# Patient Record
Sex: Male | Born: 1970 | Race: White | Hispanic: No | Marital: Single | Smoking: Never smoker
Health system: Southern US, Community
[De-identification: ages and names within clinical notes are randomized; demographics above are authoritative.]

## PROBLEM LIST (undated history)

## (undated) DIAGNOSIS — I10 Essential (primary) hypertension: Secondary | ICD-10-CM

## (undated) DIAGNOSIS — K760 Fatty (change of) liver, not elsewhere classified: Secondary | ICD-10-CM

## (undated) DIAGNOSIS — E669 Obesity, unspecified: Secondary | ICD-10-CM

## (undated) DIAGNOSIS — L309 Dermatitis, unspecified: Secondary | ICD-10-CM

## (undated) DIAGNOSIS — N200 Calculus of kidney: Secondary | ICD-10-CM

## (undated) DIAGNOSIS — Z87442 Personal history of urinary calculi: Secondary | ICD-10-CM

## (undated) DIAGNOSIS — K5900 Constipation, unspecified: Secondary | ICD-10-CM

## (undated) DIAGNOSIS — Z87440 Personal history of urinary (tract) infections: Secondary | ICD-10-CM

## (undated) DIAGNOSIS — R7303 Prediabetes: Secondary | ICD-10-CM

## (undated) DIAGNOSIS — F909 Attention-deficit hyperactivity disorder, unspecified type: Secondary | ICD-10-CM

## (undated) DIAGNOSIS — R002 Palpitations: Secondary | ICD-10-CM

## (undated) HISTORY — DX: Obesity, unspecified: E66.9

## (undated) HISTORY — DX: Prediabetes: R73.03

## (undated) HISTORY — DX: Fatty (change of) liver, not elsewhere classified: K76.0

## (undated) HISTORY — DX: Calculus of kidney: N20.0

## (undated) HISTORY — DX: Essential (primary) hypertension: I10

## (undated) HISTORY — PX: OTHER SURGICAL HISTORY: SHX169

## (undated) HISTORY — DX: Dermatitis, unspecified: L30.9

## (undated) HISTORY — DX: Attention-deficit hyperactivity disorder, unspecified type: F90.9

## (undated) HISTORY — DX: Palpitations: R00.2

---

## 2004-03-15 ENCOUNTER — Emergency Department (HOSPITAL_COMMUNITY): Admission: EM | Admit: 2004-03-15 | Discharge: 2004-03-15 | Payer: Self-pay | Admitting: Emergency Medicine

## 2005-03-11 ENCOUNTER — Emergency Department (HOSPITAL_COMMUNITY): Admission: EM | Admit: 2005-03-11 | Discharge: 2005-03-11 | Payer: Self-pay | Admitting: Family Medicine

## 2006-02-21 ENCOUNTER — Emergency Department (HOSPITAL_COMMUNITY): Admission: EM | Admit: 2006-02-21 | Discharge: 2006-02-21 | Payer: Self-pay | Admitting: Emergency Medicine

## 2008-07-29 ENCOUNTER — Emergency Department (HOSPITAL_COMMUNITY): Admission: EM | Admit: 2008-07-29 | Discharge: 2008-07-29 | Payer: Self-pay | Admitting: Family Medicine

## 2008-08-17 ENCOUNTER — Ambulatory Visit: Payer: Self-pay | Admitting: Internal Medicine

## 2008-10-02 ENCOUNTER — Ambulatory Visit: Payer: Self-pay | Admitting: Internal Medicine

## 2008-10-02 LAB — CONVERTED CEMR LAB
Basophils Absolute: 0 10*3/uL (ref 0.0–0.1)
Basophils Relative: 0.5 % (ref 0.0–3.0)
Chloride: 107 meq/L (ref 96–112)
Eosinophils Relative: 1.3 % (ref 0.0–5.0)
GFR calc Af Amer: 141 mL/min
HCT: 46.3 % (ref 39.0–52.0)
Hemoglobin: 15.7 g/dL (ref 13.0–17.0)
Lymphocytes Relative: 31.8 % (ref 12.0–46.0)
MCV: 86.6 fL (ref 78.0–100.0)
Monocytes Absolute: 0.2 10*3/uL (ref 0.1–1.0)
Monocytes Relative: 3.6 % (ref 3.0–12.0)
Neutro Abs: 4.3 10*3/uL (ref 1.4–7.7)
Potassium: 5 meq/L (ref 3.5–5.1)
Sodium: 141 meq/L (ref 135–145)
TSH: 0.83 microintl units/mL (ref 0.35–5.50)
Total Bilirubin: 0.7 mg/dL (ref 0.3–1.2)
Total CHOL/HDL Ratio: 6.5
Total Protein: 7.6 g/dL (ref 6.0–8.3)
VLDL: 16 mg/dL (ref 0–40)

## 2008-10-03 ENCOUNTER — Telehealth: Payer: Self-pay | Admitting: Internal Medicine

## 2008-10-10 ENCOUNTER — Emergency Department (HOSPITAL_COMMUNITY): Admission: EM | Admit: 2008-10-10 | Discharge: 2008-10-10 | Payer: Self-pay | Admitting: Emergency Medicine

## 2008-10-15 ENCOUNTER — Ambulatory Visit: Payer: Self-pay | Admitting: Internal Medicine

## 2008-10-15 DIAGNOSIS — R7402 Elevation of levels of lactic acid dehydrogenase (LDH): Secondary | ICD-10-CM | POA: Insufficient documentation

## 2008-10-15 DIAGNOSIS — R740 Nonspecific elevation of levels of transaminase and lactic acid dehydrogenase [LDH]: Secondary | ICD-10-CM

## 2008-10-15 LAB — CONVERTED CEMR LAB: Iron: 49 ug/dL (ref 42–165)

## 2008-10-16 ENCOUNTER — Encounter: Payer: Self-pay | Admitting: Internal Medicine

## 2008-10-17 ENCOUNTER — Encounter: Payer: Self-pay | Admitting: Internal Medicine

## 2008-10-22 ENCOUNTER — Ambulatory Visit: Payer: Self-pay | Admitting: Diagnostic Radiology

## 2008-10-22 ENCOUNTER — Ambulatory Visit (HOSPITAL_BASED_OUTPATIENT_CLINIC_OR_DEPARTMENT_OTHER): Admission: RE | Admit: 2008-10-22 | Discharge: 2008-10-22 | Payer: Self-pay | Admitting: Internal Medicine

## 2009-06-19 ENCOUNTER — Emergency Department (HOSPITAL_COMMUNITY): Admission: EM | Admit: 2009-06-19 | Discharge: 2009-06-19 | Payer: Self-pay | Admitting: Family Medicine

## 2009-06-20 ENCOUNTER — Ambulatory Visit: Payer: Self-pay | Admitting: Internal Medicine

## 2009-06-20 DIAGNOSIS — R071 Chest pain on breathing: Secondary | ICD-10-CM | POA: Insufficient documentation

## 2010-05-28 ENCOUNTER — Ambulatory Visit: Payer: Self-pay | Admitting: Family

## 2010-05-28 ENCOUNTER — Encounter: Payer: Self-pay | Admitting: Internal Medicine

## 2010-05-28 DIAGNOSIS — I10 Essential (primary) hypertension: Secondary | ICD-10-CM

## 2010-05-28 DIAGNOSIS — R5383 Other fatigue: Secondary | ICD-10-CM

## 2010-05-28 DIAGNOSIS — R5381 Other malaise: Secondary | ICD-10-CM

## 2010-06-03 DIAGNOSIS — R7309 Other abnormal glucose: Secondary | ICD-10-CM

## 2010-06-03 LAB — CONVERTED CEMR LAB
AST: 22 units/L (ref 0–37)
Albumin: 4.6 g/dL (ref 3.5–5.2)
Alkaline Phosphatase: 93 units/L (ref 39–117)
BUN: 19 mg/dL (ref 6–23)
Calcium: 9.6 mg/dL (ref 8.4–10.5)
Creatinine, Ser: 0.88 mg/dL (ref 0.40–1.50)
Glucose, Bld: 119 mg/dL — ABNORMAL HIGH (ref 70–99)
Hemoglobin: 14.7 g/dL (ref 13.0–17.0)
Lymphs Abs: 2.3 10*3/uL (ref 0.7–4.0)
MCHC: 31.3 g/dL (ref 30.0–36.0)
MCV: 89.2 fL (ref 78.0–100.0)
Monocytes Absolute: 0.5 10*3/uL (ref 0.1–1.0)
Platelets: 324 10*3/uL (ref 150–400)
Potassium: 4.5 meq/L (ref 3.5–5.3)
RBC: 5.27 M/uL (ref 4.22–5.81)
RDW: 14.7 % (ref 11.5–15.5)
Sodium: 139 meq/L (ref 135–145)
Total Bilirubin: 0.4 mg/dL (ref 0.3–1.2)

## 2010-06-11 LAB — CONVERTED CEMR LAB: Hgb A1c MFr Bld: 6 % — ABNORMAL HIGH (ref <5.7)

## 2010-06-13 ENCOUNTER — Ambulatory Visit: Payer: Self-pay | Admitting: Internal Medicine

## 2010-06-13 DIAGNOSIS — J309 Allergic rhinitis, unspecified: Secondary | ICD-10-CM | POA: Insufficient documentation

## 2010-06-13 DIAGNOSIS — R0609 Other forms of dyspnea: Secondary | ICD-10-CM | POA: Insufficient documentation

## 2010-06-13 DIAGNOSIS — R0989 Other specified symptoms and signs involving the circulatory and respiratory systems: Secondary | ICD-10-CM

## 2010-06-24 ENCOUNTER — Ambulatory Visit: Payer: Self-pay | Admitting: Pulmonary Disease

## 2010-07-23 ENCOUNTER — Encounter: Payer: Self-pay | Admitting: Pulmonary Disease

## 2010-07-23 ENCOUNTER — Ambulatory Visit (HOSPITAL_BASED_OUTPATIENT_CLINIC_OR_DEPARTMENT_OTHER): Admission: RE | Admit: 2010-07-23 | Discharge: 2010-07-23 | Payer: Self-pay | Admitting: Pulmonary Disease

## 2010-07-25 ENCOUNTER — Ambulatory Visit: Payer: Self-pay | Admitting: Pulmonary Disease

## 2010-08-05 ENCOUNTER — Ambulatory Visit: Payer: Self-pay | Admitting: Pulmonary Disease

## 2010-08-15 ENCOUNTER — Encounter: Payer: Self-pay | Admitting: Pulmonary Disease

## 2010-08-26 ENCOUNTER — Telehealth: Payer: Self-pay | Admitting: Pulmonary Disease

## 2010-11-23 LAB — CONVERTED CEMR LAB
Albumin ELP: 57.2 % (ref 55.8–66.1)
Ceruloplasmin: 44 mg/dL (ref 21–63)
HCV Ab: NEGATIVE
Hep B Core Total Ab: NEGATIVE
Hepatitis B Surface Ag: NEGATIVE

## 2010-11-25 NOTE — Progress Notes (Signed)
  Phone Note Outgoing Call   Summary of Call: Tristan Perez was unable to contact him to do the oximetry. Can we confirm they have the right no?? Initial call taken by: Comer Locket. Vassie Loll MD,  August 26, 2010 9:16 AM  Follow-up for Phone Call        Called [phone] and [email or web address] if he has not heard from [employer] to give me a call back. Called [employer] and spoke with c/s who advised that is the same # they have on file and have left messages. Will close this phone note. Tristan Perez CMA  August 26, 2010 9:47 AM

## 2010-11-25 NOTE — Assessment & Plan Note (Signed)
Summary: EXTREME FATIGUE/DT--Rm 4   Vital Signs:  Patient profile:   40 year old male Height:      73 inches Weight:      360.25 pounds BMI:     47.70 Temp:     98.1 degrees F oral Pulse rate:   84 / minute Pulse rhythm:   regular Resp:     16 per minute BP sitting:   154 / 90  (right arm) Cuff size:   k  Vitals Entered By: Mervin Kung CMA Duncan Dull) (May 28, 2010 8:07 AM) CC: Room 4  Pt states he has been having extreme fatigue with dizziness x 3-4 days. Is Patient Diabetic? No   Primary Care Provider:  Dondra Spry DO  CC:  Room 4  Pt states he has been having extreme fatigue with dizziness x 3-4 days.Marland Kitchen  History of Present Illness: Tristan Perez is a 40 year old male who presents today with complaint of extreme fatigue x 3-4 days.  This fatigue is constant,  this is associated with sense of dizziness, light headedness.  Denies any changes in sleep pattern.  [place] episode at the beach in the middle of [place] which lasted about 24 hours.  He attributed this to dehydration.  Notes that he had a fever of 99.5 on both occasions.   Took aleve and fever resolved.  Denies nausea, vomitting, diarrhea, or abdominal pain.  Denies melena or BRBPR.   Denies sinus pain, pressure, or ear pain.  Patient notes that he snores.  Notes that he had a sleep study which was done at the request of Dr. Catha Gosselin 5-6 years ago which noted snoring, but did not diagnose him with sleep apnea.  Since that time, he has gained 40-50 pounds.    Allergies (verified): No Known Drug Allergies  Review of Systems       see HPI  Physical Exam  General:  Morbidly obese white male awake, alert and in NAD Head:  Normocephalic and atraumatic without obvious abnormalities. No apparent alopecia or balding. Mouth:  Oral mucosa and oropharynx without lesions or exudates.  Teeth in good repair. Neck:  No deformities, masses, or tenderness noted. Lungs:  Normal respiratory effort, chest expands symmetrically.  Lungs are clear to auscultation, no crackles or wheezes. Heart:  Normal rate and regular rhythm. S1 and S2 normal without gallop, murmur, click, rub or other extra sounds. Extremities:  No clubbing, cyanosis, edema, or deformity noted  Neurologic:  alert & oriented X3.   Psych:  Cognition and judgment appear intact. Alert and cooperative with normal attention span and concentration. No apparent delusions, illusions, hallucinations   Impression & Recommendations:  Problem # 1:  FATIGUE (ICD-780.79) Assessment New Will check CBC to rule out anemia, check thyroid to rule out hypothyroid, check bmet.  Will also refer to sleep disorder clinic- it is possible that he has developed sleep apnea which could be contributing to his fatigue given 50 lb weight gain since last sleep study.  Fatigue may also be related to acute viral illness.  EKG performed today notes NSR.  Orders: TLB-BMP (Basic Metabolic Panel-BMET) ([id number]) TLB-TSH (Thyroid Stimulating Hormone) (84443-TSH) TLB-CBC Platelet - w/Differential (85025-CBCD) Sleep Disorder Referral (Sleep Disorder)  Problem # 2:  HYPERTENSION (ICD-401.9) Assessment: Deteriorated Will plan to treat with amlodipine, pt counseled on low sodium diet.  Follow up in 1 month His updated medication list for this problem includes:    Amlodipine Besylate 5 Mg Tabs (Amlodipine besylate) .Marland KitchenMarland KitchenMarland KitchenMarland Kitchen  One tablet by mouth daily  Orders: EKG w/ Interpretation (93000)  BP today: 154/90 Prior BP: 140/90 (06/20/2009)  Labs Reviewed: K+: 5.0 (10/02/2008) Creat: : 0.8 (10/02/2008)   Chol: 171 (10/02/2008)   HDL: 26.3 (10/02/2008)   LDL: 128 (10/02/2008)   TG: 82 (10/02/2008)  Complete Medication List: 1)  Amlodipine Besylate 5 Mg Tabs (Amlodipine besylate) .... One tablet by mouth daily  Other Orders: TLB-Hepatic/Liver Function Pnl ([id number]-HEPATIC)  Patient Instructions: 1)  Please complete labs on the first floor today. 2)  Start amlodipine. 3)  Call if fever  over 101, or if your symptoms worsen.  4)  Follow up with Dr. Artist Pais in 2 weeks. Prescriptions: AMLODIPINE BESYLATE 5 MG TABS (AMLODIPINE BESYLATE) one tablet by mouth daily  #30 x 1   Entered and Authorized by:   Lemont Fillers FNP   Signed by:   Lemont Fillers FNP on 05/28/2010   Method used:   [facility]ronically to        [facility] [phone]* (retail)       [address]       [place], [place]  [address]       Ph: [phone]       Fax: [phone]   RxID:   [id number]   Current Allergies (reviewed today): No known allergies

## 2010-11-25 NOTE — Assessment & Plan Note (Signed)
Summary: follow up after sleep study in [place]//jwr   Visit Type:  Follow-up Primary Tristan Perez/Referring Kris No:  [name] DO  CC:  Follow up after sleep study.  History of Present Illness: 39/M, morbidly obese for evaluation of obstructive sleep apnea  c/o bouts of extreme tiredness in afternoon ,  no caffeine takes an hour to get going in am Epworth Sleepiness Score 12, watching TV, reading , no probs  driving. Late bedtime - always been a night owl, 0100, no TV in bedroom, latency 5 mins, sleeps o side x 2 pillows, 2-3 awakenings, BR visits, oob at 0730 feeling tired, daughter can hear her snoring Gained 40 lbs in last couple of years. Weekends - more sleep but feel worse PSG in 2005 -said he was a snorer but not obstructive sleep apnea   August 05, 2010 2:20 PM  Reviewed PSG >> 3 h TST, no supine sleep AHI 1.4/h, 30 RERAs with RDI 12/h - not sufficient to intervene Susprisingly, low satn x 34 mins -  ? artifact  Preventive Screening-Counseling & Management  Alcohol-Tobacco     Alcohol drinks/day: 0     Smoking Status: never  Current Medications (verified): 1)  Amlodipine Besylate 5 Mg Tabs (Amlodipine Besylate) .... One Tablet By Mouth Daily 2)  Metformin Hcl 500 Mg Tabs (Metformin Hcl) .... One By Mouth Two Times A Day  Allergies (verified): No Known Drug Allergies  Past History:  Past Medical History: Last updated: 06/13/2010 Morbid Obesity Hx of elevated BP w/o diagnosis of hypertension Childhood chickenpox  Hx of abnormal LFT's Fatty liver Episode of palpitations 5 yrs - seen by cardiologist (Echo and EKG reported normal)  Social History: Last updated: 10/15/2008 Occupation: Customer service - Community education officer Divorced Shared custody of daughter Never Smoked Alcohol use-no   Review of Systems  The patient denies anorexia, fever, weight loss, weight gain, vision loss, decreased hearing, hoarseness, chest pain, syncope, dyspnea on exertion, peripheral edema,  prolonged cough, headaches, hemoptysis, abdominal pain, melena, hematochezia, severe indigestion/heartburn, hematuria, muscle weakness, suspicious skin lesions, difficulty walking, depression, unusual weight change, abnormal bleeding, enlarged lymph nodes, and angioedema.    Vital Signs:  Patient profile:   40 year old male Height:      73 inches Weight:      371 pounds BMI:     49.12 O2 Sat:      96 % on Room air Temp:     98.8 degrees F oral Pulse rate:   115 / minute BP sitting:   110 / 78  (right arm) Cuff size:   large  Vitals Entered By: Zackery Barefoot CMA (August 05, 2010 2:07 PM)  O2 Flow:  Room air CC: Follow up after sleep study Comments Medications reviewed with patient Verified contact number and pharmacy with patient Zackery Barefoot CMA  August 05, 2010 2:07 PM    Physical Exam  Additional Exam:  Gen. Pleasant, well-nourished, in no distress, normal affect, wt 360 ENT - no lesions, no post nasal drip, class 3 airway Neck: No JVD, no thyromegaly, no carotid bruits Lungs: no use of accessory muscles, no dullness to percussion, clear without rales or rhonchi  Cardiovascular: Rhythm regular, heart sounds  normal, no murmurs or gallops, no peripheral edema Musculoskeletal: No deformities, no cyanosis or clubbing      Impression & Recommendations:  Problem # 1:  SNORING (ICD-786.09) Discussed pSg results, represents upper airway resistance Weight loss can be curative at this time, if worsens or if persistent fatigue, may  need CPAP intervention. Recehck nocturnal oximetry for desaturation noted during study - likely this was na artifact because he does not have underlying cardiopulmonary disease. Orders: Est. Patient Level III (81191) DME Referral (DME)  Patient Instructions: 1)  Copy sent to:dr Artist Pais 2)  Please schedule a follow-up appointment as needed. 3)  We will check your oxygen levels during sleep & let you know   Not Administered:    Influenza  Vaccine not given due to: will receive at work

## 2010-11-25 NOTE — Letter (Signed)
Summary: Referral attempts/[facility]  Referral attempts/[facility]   Imported By: Lester Saratoga 08/26/2010 08:30:46  _____________________________________________________________________  External Attachment:    Type:   Image     Comment:   External Document

## 2010-11-25 NOTE — Assessment & Plan Note (Signed)
Summary: sleep apnea/reschedulled from 04/17/2010/apc   Primary Cleveland Yarbro/Referring Rubena Roseman:  Dondra Spry DO  CC:  Pt here for sleep consult.  History of Present Illness: 39/M, morbidly obese for evaluation of obstructive sleep apnea  c/o bouts of extreme tiredness in afternoon ,  no caffeine takes an hour to get going in am Epworth Sleepiness Score 12, watching TV, reading , no probs  driving. Late bedtime - always been a night owl, 0100, no TV in bedroom, latency 5 mins, sleeps o side x 2 pillows, 2-3 awakenings, BR visits, oob at 0730 feeling tired, daughter can hear her snoring Gained 40 lbs in last couple of years. Weekends - more sleep but feel worse PSG in 2005 -said he was a snorer but not obstructive sleep apnea  There is no history suggestive of cataplexy, sleep paralysis or parasomnias   Preventive Screening-Counseling & Management  Alcohol-Tobacco     Alcohol drinks/day: 0     Smoking Status: never   History of Present Illness: seem to be tired all the time even when I get a full night sleep  What time do you typically go to bed?(between what hours): 1am-2am  How long does it take you to fall asleep? 5 minutes  How many times during the night do you wake up? 1 to 2 times  What time do you get out of bed to start your day? 7:30am  Do you drive or operate heavy machinery in your occupation? no  How much has your weight changed (up or down) over the past two years? (in pounds): 40 lbs increase  Have you ever had a sleep study before?  If yes,when and where: yes 2005  Do you currently use CPAP ? If so , at what pressure? no  Do you wear oxygen at any time? If yes, how many liters per minute? no Current Medications (verified): 1)  Amlodipine Besylate 5 Mg Tabs (Amlodipine Besylate) .... One Tablet By Mouth Daily 2)  Metformin Hcl 500 Mg Tabs (Metformin Hcl) .... One By Mouth Two Times A Day  Allergies (verified): No Known Drug Allergies  Past  History:  Past Medical History: Last updated: 06/20/2009 Morbid Obesity Hx of elevated BP w/o diagnosis of hypertension Childhood chickenpox Hx of abnormal LFT's Fatty liver Episode of palpitations 5 yrs - seen by cardiologist (Echo and EKG reported normal)  Past Surgical History: Last updated: 10/15/2008 Ingrown toe nail surgery 06/2008   Family History: Last updated: 10/15/2008 Htn - sister Obesity - father Mollie [place] - father Colon ca - no Prostate ca - no Diabetes Mellitus - no  No history of chronic liver disease  Social History: Last updated: 10/15/2008 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  The patient denies shortness of breath with activity, shortness of breath at rest, productive cough, non-productive cough, coughing up blood, chest pain, irregular heartbeats, acid heartburn, indigestion, loss of appetite, abdominal pain, difficulty swallowing, sore throat, tooth/dental problems, headaches, sneezing, itching, ear ache, anxiety, depression, hand/feet swelling, joint stiffness or pain, rash, change in color of mucus, and fever.    Vital Signs:  Patient profile:   40 year old male Height:      73 inches Weight:      360 pounds BMI:     47.67 O2 Sat:      95 % on  Room air Temp:     99.1 degrees F oral Pulse rate:   108 / minute BP sitting:   126 / 82  (left arm) Cuff size:   large  Vitals Entered By: Zackery Barefoot CMA (June 24, 2010 4:21 PM)  O2 Flow:  Room air CC: Pt here for sleep consult Comments Medications reviewed with patient Verified contact number and pharmacy with patient Zackery Barefoot CMA  June 24, 2010 4:26 PM    Physical Exam  Additional Exam:  Gen. Pleasant, well-nourished, in no distress, normal affect, wt 360 ENT - no lesions, no post nasal drip,  class 3 airway Neck: No JVD, no thyromegaly, no carotid bruits Lungs: no use of accessory muscles, no dullness to percussion, clear without rales or rhonchi  Cardiovascular: Rhythm regular, heart sounds  normal, no murmurs or gallops, no peripheral edema Abdomen: soft and non-tender, no hepatosplenomegaly, BS normal. Musculoskeletal: No deformities, no cyanosis or clubbing Neuro:  alert, non focal     Impression & Recommendations:  Problem # 1:  SNORING (ICD-786.09) In v/o excessive daytime somnolence , loud snoring & morbid obesity & narrow pharyngeal exam, obstructive sleep apnea very likely & an overnight pSG will be scheduled as a split study. The pathophysiology of obstructive sleep apnea, it's cardiovascular consequences and modes of treatment including CPAP were discussed with the patient in great detail.  Orders: Sleep Disorder Referral (Sleep Disorder) Consultation Level III (16109)  Problem # 2:  MORBID OBESITY (ICD-278.01)  weight loss encouraged  Orders: Consultation Level III (60454)  Patient Instructions: 1)  Copy sent to:DR Artist Pais 2)  Please schedule a follow-up appointment in 2 weeks after sleep study 3)  Weight Loss

## 2010-11-25 NOTE — Assessment & Plan Note (Signed)
Summary: 2 week follow up/mhf   Vital Signs:  Patient profile:   40 year old male Height:      73 inches Weight:      364.50 pounds BMI:     48.26 O2 Sat:      96 % on Room air Temp:     98.1 degrees F oral Pulse rate:   90 / minute Pulse rhythm:   regular Resp:     18 per minute BP sitting:   132 / 80  (right arm) Cuff size:   large  Vitals Entered By: Glendell Docker CMA (June 13, 2010 8:17 AM)  O2 Flow:  Room air CC: 2 Week Follow up  Is Patient Diabetic? No Pain Assessment Patient in pain? no      Comments no concerns   Primary Care Provider:  Dondra Spry DO  CC:  2 Week Follow up .  History of Present Illness: 40 y/o male prev seen by NP for severe fatigue labs - CBC, TSH, BMET normal pt scheduled for sleep study wt gain since prev visit  Preventive Screening-Counseling & Management  Alcohol-Tobacco     Smoking Status: never  Allergies (verified): No Known Drug Allergies  Past History:  Past Medical History: Morbid Obesity Hx of elevated BP w/o diagnosis of hypertension Childhood chickenpox  Hx of abnormal LFT's Fatty liver Episode of palpitations 5 yrs - seen by cardiologist (Echo and EKG reported normal)  Family History: Htn - sister Obesity - father Mollie [place] - father Colon ca - no Prostate ca - no Diabetes Mellitus - yes No history of chronic liver disease  Physical Exam  General:  alert and overweight-appearing.   Mouth:  pharynx pink and moist and pharyngeal crowding.   Lungs:  normal respiratory effort, normal breath sounds, and no wheezes.   Heart:  normal rate, regular rhythm, and no gallop.   Psych:  normally interactive and good eye contact.     Impression & Recommendations:  Problem # 1:  SNORING (ICD-786.09) Pt has appt for sleep study already. he understands importance of wt loss.  he is currently on nutra system diet  Problem # 2:  HYPERGLYCEMIA (ICD-790.29)  A1c is 6.0.  + fam hx of diabetes.  Pt counseled on  diet and exercise. add metformin.  pt advised to start to 1/2 tab to minimize GI side effects  His updated medication list for this problem includes:    Metformin Hcl 500 Mg Tabs (Metformin hcl) ..... One by mouth two times a day  Labs Reviewed: Creat: 0.88 (05/28/2010)     Future Orders: T-Basic Metabolic Panel [id number]) ... 09/08/2010 T- Hemoglobin A1C ([id number]) ... 09/08/2010  Problem # 3:  ALLERGIC RHINITIS (ICD-477.9) He reports intermittent post nasal gtt and spitting up phlegm in AM Pt to use OTC antihistamines. He defers allergy testing for now  Complete Medication List: 1)  Amlodipine Besylate 5 Mg Tabs (Amlodipine besylate) .... One tablet by mouth daily 2)  Metformin Hcl 500 Mg Tabs (Metformin hcl) .... One by mouth two times a day  Patient Instructions: 1)  Please schedule a follow-up appointment in 3 months. 2)  BMP prior to visit, ICD-9: 790.29 3)  HbgA1C prior to visit, ICD-9:  790.29 4)  Please return for lab work one (1) week before your next appointment.  Prescriptions: METFORMIN HCL 500 MG TABS (METFORMIN HCL) one by mouth two times a day  #60 x 2   Entered and Authorized by:   D. Thomos Lemons  DO   Signed by:   D. Thomos Lemons DO on 06/13/2010   Method used:   [facility] to        [facility] [phone][facility] (retail)       [address]       [place], [place]  [address]       Ph: [phone]       Fax: [phone]   RxID:   [id number]   Current Allergies (reviewed today): No known allergies

## 2011-12-28 ENCOUNTER — Ambulatory Visit: Payer: Medicare HMO

## 2011-12-28 ENCOUNTER — Ambulatory Visit (INDEPENDENT_AMBULATORY_CARE_PROVIDER_SITE_OTHER): Payer: Medicare HMO | Admitting: Family Medicine

## 2011-12-28 VITALS — BP 105/80 | HR 101 | Temp 98.7°F | Resp 20 | Ht 71.5 in | Wt 369.6 lb

## 2011-12-28 DIAGNOSIS — R05 Cough: Secondary | ICD-10-CM

## 2011-12-28 DIAGNOSIS — R0989 Other specified symptoms and signs involving the circulatory and respiratory systems: Secondary | ICD-10-CM

## 2011-12-28 DIAGNOSIS — R059 Cough, unspecified: Secondary | ICD-10-CM

## 2011-12-28 DIAGNOSIS — J988 Other specified respiratory disorders: Secondary | ICD-10-CM

## 2011-12-28 MED ORDER — DOXYCYCLINE HYCLATE 100 MG PO TABS: 100.0000 mg | ORAL_TABLET | Freq: Two times a day (BID) | ORAL | Status: AC

## 2011-12-28 NOTE — Progress Notes (Signed)
  [facility]:  Office Visit  Chief Complaint:  Chief Complaint  Patient presents with  . Fever  . Cough    green/yelloyish phlegm ; congestion - head and chest x 2 weeks    HPI: Tristan Perez is a 41 y.o. male who complains of  8 days of sinus congestion, fever Tmax 102 was given Amoxacillin initially, felt better for 2-3 days then came back with Fever. Switched to Clarithromycin x 5 days. Last temp was 100.0 this AM. Worried about waxing and waning Temp, tried OTC meds with some releif for fever. Marland Kitchen He feels tremendous sinus congestion but now feels it in his chest. Assoc with cough.   Past Medical History  Diagnosis Date  . Obesity    No past surgical history on file. History   Social History  . Marital Status: Single    Spouse Name: N/A    Number of Children: N/A  . Years of Education: N/A   Social History Main Topics  . Smoking status: Never Smoker   . Smokeless tobacco: Not on file  . Alcohol Use: Not on file  . Drug Use: Not on file  . Sexually Active: Not on file   Other Topics Concern  . Not on file   Social History Narrative  . No narrative on file   No family history on file. No Known Allergies Prior to Admission medications   Medication Sig Start Date End Date Taking? Authorizing Provider  clarithromycin (BIAXIN) 500 MG tablet Take 500 mg by mouth 2 (two) times daily.   Yes Historical Provider, MD     ROS: The patient denies chills, night sweats, unintentional weight loss, chest pain, palpitations, wheezing, dyspnea on exertion, nausea, vomiting, abdominal pain, dysuria, hematuria, melena, numbness, weakness, or tingling. + fever, congestion, msk aches,   All other systems have been reviewed and were otherwise negative with the exception of those mentioned in the HPI and as above.    PHYSICAL EXAM: Filed Vitals:   12/28/11 1531  BP: 105/80  Pulse:   Temp:   Resp:    Filed Vitals:   12/28/11 1409  Height: 5' 11.5" (1.816 m)   Weight: 369 lb 9.6 oz (167.649 kg)   Body mass index is 50.83 kg/(m^2).  General: Alert, no acute distress, morbidly obese HEENT:  Normocephalic, atraumatic, oropharynx patent. Tm nl, + sinus tenderness, + red boggy nares, no exudates. Cardiovascular:  Regular rate and rhythm, no rubs murmurs or gallops.  No Carotid bruits, radial pulse intact. No pedal edema.  Respiratory: Clear to auscultation bilaterally.  No wheezes, rales, or rhonchi.  No cyanosis, no use of accessory musculature GI: No organomegaly, abdomen is soft and non-tender, positive bowel sounds.  No masses. Skin: No rashes. Neurologic: Facial musculature symmetric. Psychiatric: Patient is appropriate throughout our interaction. Lymphatic: No cervical lymphadenopathy Musculoskeletal: Gait intact.   LABS: EKG/XRAY:   Primary read interpreted by Dr. Conley Rolls at [facility]. ? Right LL infiltrate vs hilar LAD    ASSESSMENT/PLAN: Encounter Diagnoses  Name Primary?  . Cough Yes  . Congestion of upper airway     DC Biaxin Start Doxycycline for ? RLL infiltrate.  C/w Mucinex without D Check BP at home. IF BP continues to be higher than 140/90 after sxs of head cold and ? PNA are resolved then need to f/u for BP meds. Patient understands and agrees.   Hamilton Capri PHUONG, DO 12/28/2011 3:32 PM

## 2012-01-02 ENCOUNTER — Telehealth: Payer: Self-pay | Admitting: Family Medicine

## 2012-01-02 NOTE — Telephone Encounter (Signed)
Left message to see how he was doing congestion wise, let him know that radiology report was negative for PNA  Although he should still continue with Doxyxycline and finish it out, also need to moniotr his BP if higher than 140/90 consistently  Then need to f/u for BP meds.

## 2014-03-03 ENCOUNTER — Encounter (HOSPITAL_COMMUNITY): Payer: Self-pay | Admitting: Emergency Medicine

## 2014-03-03 ENCOUNTER — Emergency Department (HOSPITAL_COMMUNITY)
Admission: EM | Admit: 2014-03-03 | Discharge: 2014-03-03 | Disposition: A | Discharge status: Home / Self Care | Payer: Managed Care, Other (non HMO) | Source: Home / Self Care | Attending: Emergency Medicine | Admitting: Emergency Medicine

## 2014-03-03 DIAGNOSIS — L905 Scar conditions and fibrosis of skin: Secondary | ICD-10-CM

## 2014-03-03 NOTE — ED Provider Notes (Signed)
Medical screening examination/treatment/procedure(s) were performed by non-physician practitioner and as supervising physician I was immediately available for consultation/collaboration.  Laquinton Bihm, M.D.  Eleazar Kimmey C Rhiann Boucher, MD 03/03/14 1750 

## 2014-03-03 NOTE — ED Notes (Signed)
Pt c/o rash/skin irritation around old scar on left shin Denies recent inj/trauma3 Ambulated well to exam room w/NAD Alert w/no signs of acute distress.

## 2014-03-03 NOTE — ED Provider Notes (Signed)
CSN: [id number]     Arrival date & time 03/03/14  1350 History   First MD Initiated Contact with Patient 03/03/14 1441     Chief Complaint  Patient presents with  . Rash   (Consider location/radiation/quality/duration/timing/severity/associated sxs/prior Treatment) HPI Comments: 43 year old male presents complaining of redness over a scar on his leg. Approximately 20 years ago, he had a laceration on his leg that cut down to his tibia. He has had no issues with this injury since then, but 2 days ago his parents noticed that his scar was really red. His scar has never looked like this before. It still feels fine, there is no pain associated with the scar or any other new symptoms. he feels well overall. No numbness in the foot. No known history of peripheral arterial disease.  Patient is a 43 y.o. male presenting with rash.  Rash   Past Medical History  Diagnosis Date  . Obesity    History reviewed. No pertinent past surgical history. No family history on file. History  Substance Use Topics  . Smoking status: Never Smoker   . Smokeless tobacco: Not on file  . Alcohol Use: Not on file    Review of Systems  Skin: Positive for color change (see history of present illness).  All other systems reviewed and are negative.   Allergies  Review of patient's allergies indicates no known allergies.  Home Medications   Prior to Admission medications   Not on File   BP 186/109  Pulse 104  Temp(Src) 98.9 F (37.2 C) (Oral)  Resp 23  SpO2 95% Physical Exam  Nursing note and vitals reviewed. Constitutional: He is oriented to person, place, and time. He appears well-developed and well-nourished. No distress.  HENT:  Head: Normocephalic.  Pulmonary/Chest: Effort normal. No respiratory distress.  Musculoskeletal:       Legs: Neurological: He is alert and oriented to person, place, and time. Coordination normal.  Skin: Skin is warm and dry. No rash noted. He is not diaphoretic.    Psychiatric: He has a normal mood and affect. Judgment normal.    ED Course  Procedures (including critical care time) Labs Review Labs Reviewed - No data to display  Imaging Review No results found.   MDM   1. Scar of lower leg    The patient may be starting to have a small amount of skin breakdown but at this point is asymptomatic, just with a very small amount of redness on the leg is in the scar. It is also possible that his scar has always like this, the scar does not look particularly abnormal. Watchful waiting for now. Followup if he starts to get any skin breakdown or pain associated with this.  Graylon GoodZachary H Kellianne Ek, PA-C 03/03/14 1455

## 2016-09-08 ENCOUNTER — Encounter (HOSPITAL_COMMUNITY): Payer: Self-pay | Admitting: Emergency Medicine

## 2016-09-08 ENCOUNTER — Emergency Department (HOSPITAL_COMMUNITY): Payer: Managed Care, Other (non HMO)

## 2016-09-08 ENCOUNTER — Emergency Department (HOSPITAL_COMMUNITY)
Admission: EM | Admit: 2016-09-08 | Discharge: 2016-09-09 | Disposition: A | Discharge status: Home / Self Care | Payer: Managed Care, Other (non HMO) | Attending: Emergency Medicine | Admitting: Emergency Medicine

## 2016-09-08 DIAGNOSIS — N201 Calculus of ureter: Secondary | ICD-10-CM

## 2016-09-08 DIAGNOSIS — N132 Hydronephrosis with renal and ureteral calculous obstruction: Secondary | ICD-10-CM | POA: Insufficient documentation

## 2016-09-08 DIAGNOSIS — R1032 Left lower quadrant pain: Secondary | ICD-10-CM | POA: Diagnosis present

## 2016-09-08 DIAGNOSIS — I10 Essential (primary) hypertension: Secondary | ICD-10-CM | POA: Insufficient documentation

## 2016-09-08 DIAGNOSIS — R109 Unspecified abdominal pain: Secondary | ICD-10-CM

## 2016-09-08 MED ORDER — SODIUM CHLORIDE 0.9 % IV SOLN
INTRAVENOUS | Status: DC
  Administered 2016-09-09: via INTRAVENOUS

## 2016-09-08 MED ORDER — FENTANYL CITRATE (PF) 100 MCG/2ML IJ SOLN
50.0000 ug | Freq: Once | INTRAMUSCULAR | Status: AC
  Administered 2016-09-09: 50 ug via INTRAVENOUS
  Filled 2016-09-08: qty 2

## 2016-09-08 MED ORDER — ONDANSETRON HCL 4 MG/2ML IJ SOLN
4.0000 mg | Freq: Once | INTRAMUSCULAR | Status: AC
  Administered 2016-09-09: 4 mg via INTRAVENOUS
  Filled 2016-09-08: qty 2

## 2016-09-08 NOTE — ED Triage Notes (Signed)
Pt is c/o left flank pain that started today around 3 pm  Pt state the pain radiates around to the front and he has nausea associated with the pain  No vomiting at this time  Pt states he had the same on Sunday but it resolved

## 2016-09-08 NOTE — ED Provider Notes (Signed)
[facility]-EMERGENCY DEPT Provider Note   CSN: [id number] Arrival date & time: 09/08/16  2011 By signing my name below, I, Tristan Perez, attest that this documentation has been prepared under the direction and in the presence of No att. providers found. Electronically Signed: [name], ED Scribe. 09/08/16. 11:34 PM.  Time seen 23:34 PM   History   Chief Complaint Chief Complaint  Patient presents with  . Flank Pain   The history is provided by the patient. No language interpreter was used.   HPI Comments: Tristan Perez is a 45 y.o. male who presents to the Emergency Department complaining of sudden left flank pain that began about 3 pm tonight after urinating.  He reports he had similar pain on November 12 however he took Tylenol and it went away. Pt also reports constipation with no BM since November 12 and difficulty urinating tonight. Pt states his pain radiates to his left lower abdomen and is worse when lying flat. Pt states his pain began after urinating 9 hours ago. He took Tylenol with no relief. Pt denies any fevers, chills, hematuria,N/V/D or any other symptoms at this time. Pt has no family history of kidney stones. He denies any injury or change in activity. He denies any change in his diet to account for the constipation.  PCP Tristan Perez.   Past Medical History:  Diagnosis Date  . Obesity    Patient Active Problem List   Diagnosis Date Noted  . ALLERGIC RHINITIS 06/13/2010  . SNORING 06/13/2010  . HYPERGLYCEMIA 06/03/2010  . HYPERTENSION 05/28/2010  . FATIGUE 05/28/2010  . CHEST WALL PAIN, ANTERIOR 06/20/2009  . TRANSAMINASES, SERUM, ELEVATED 10/15/2008  . MORBID OBESITY 08/17/2008   Past Surgical History:  Procedure Laterality Date  . extraction of wisdom teeth      Home Medications    Prior to Admission medications   Medication Sig Start Date End Date Taking? Authorizing Provider  acetaminophen (TYLENOL) 500 MG tablet Take 1,000 mg by mouth every  6 (six) hours as needed for mild pain.   Yes Historical Provider, MD  ciprofloxacin (CIPRO) 500 MG tablet Take 1 tablet (500 mg total) by mouth 2 (two) times daily. 09/09/16   Tristan AlbeIva Kenyada Hy, MD  ondansetron (ZOFRAN) 4 MG tablet Take 1 tablet (4 mg total) by mouth every 8 (eight) hours as needed for nausea or vomiting. 09/09/16   Tristan AlbeIva Annamay Laymon, MD  PERCOCET 5-325 MG tablet Take 1-2 tablets by mouth every 6 (six) hours as needed for severe pain. 09/09/16   Tristan AlbeIva Tristan Szuch, MD  tamsulosin (FLOMAX) 0.4 MG CAPS capsule Take 1 po QD until you pass the stone. 09/09/16   Tristan AlbeIva Tristan Buth, MD   Family History Family History  Problem Relation Age of Onset  . Diabetes Father   . Hypertension Father   . Stroke Other    Social History Social History  Substance Use Topics  . Smoking status: Never Smoker  . Smokeless tobacco: Never Used  . Alcohol use No   Pt works at call center agent for [employer]   Allergies   Patient has no known allergies.  Review of Systems Review of Systems  Constitutional: Negative for chills and fever.  Gastrointestinal: Positive for constipation. Negative for diarrhea, nausea and vomiting.  Genitourinary: Positive for difficulty urinating and flank pain. Negative for hematuria.  All other systems reviewed and are negative.  Physical Exam Updated Vital Signs BP 132/97 (BP Location: Left Arm)   Pulse 109   Temp 98.2 F (36.8 C) (  Oral)   Resp 16   Ht 6' (1.829 m)   Wt (!) 391 lb (177.4 kg)   SpO2 95%   BMI 53.03 kg/m   Vital signs normal except for tachycardia   Physical Exam  Constitutional: He is oriented to person, place, and time. He appears well-developed and well-nourished.  Non-toxic appearance. He does not appear ill. No distress.  Morbidly obese  HENT:  Head: Normocephalic and atraumatic.  Right Ear: External ear normal.  Left Ear: External ear normal.  Nose: Nose normal. No mucosal edema or rhinorrhea.  Mouth/Throat: Oropharynx is clear and moist and mucous  membranes are normal. No dental abscesses or uvula swelling.  Eyes: Conjunctivae and EOM are normal. Pupils are equal, round, and reactive to light.  Neck: Normal range of motion and full passive range of motion without pain. Neck supple.  Cardiovascular: Normal rate, regular rhythm and normal heart sounds.  Exam reveals no gallop and no friction rub.   No murmur heard. Pulmonary/Chest: Effort normal and breath sounds normal. No respiratory distress. He has no wheezes. He has no rhonchi. He has no rales. He exhibits no tenderness and no crepitus.  Abdominal: Soft. Normal appearance and bowel sounds are normal. He exhibits no distension. There is no tenderness. There is no rebound and no guarding.  Genitourinary:  Genitourinary Comments: No CVA tenderness. No thoracic or lumbar spinal tenderness but indicates pain is in left flank.   Musculoskeletal: Normal range of motion. He exhibits no edema or tenderness.       Back:  Moves all extremities well.  Area of pain noted  Neurological: He is alert and oriented to person, place, and time. He has normal strength. No cranial nerve deficit.  Skin: Skin is warm, dry and intact. No rash noted. No erythema. No pallor.  Psychiatric: He has a normal mood and affect. His speech is normal and behavior is normal. His mood appears not anxious.  Nursing note and vitals reviewed.  ED Treatments / Results  DIAGNOSTIC STUDIES: Oxygen Saturation is 95% on room air, normal by my interpretation.    Labs (all labs ordered are listed, but only abnormal results are displayed) Results for orders placed or performed during the hospital encounter of 09/08/16  Urinalysis, Routine w reflex microscopic- may I&O cath if menses  Result Value Ref Range   Color, Urine YELLOW YELLOW   APPearance CLOUDY (A) CLEAR   Specific Gravity, Urine 1.026 1.005 - 1.030   pH 6.0 5.0 - 8.0   Glucose, UA NEGATIVE NEGATIVE mg/dL   Hgb urine dipstick LARGE (A) NEGATIVE   Bilirubin Urine  SMALL (A) NEGATIVE   Ketones, ur 15 (A) NEGATIVE mg/dL   Protein, ur NEGATIVE NEGATIVE mg/dL   Nitrite NEGATIVE NEGATIVE   Leukocytes, UA NEGATIVE NEGATIVE  Comprehensive metabolic panel  Result Value Ref Range   Sodium 138 135 - 145 mmol/L   Potassium 4.3 3.5 - 5.1 mmol/L   Chloride 106 101 - 111 mmol/L   CO2 21 (L) 22 - 32 mmol/L   Glucose, Bld 128 (H) 65 - 99 mg/dL   BUN 18 6 - 20 mg/dL   Creatinine, Ser 9.60 0.61 - 1.24 mg/dL   Calcium 9.5 8.9 - 45.4 mg/dL   Total Protein 7.7 6.5 - 8.1 g/dL   Albumin 4.3 3.5 - 5.0 g/dL   AST 27 15 - 41 U/L   ALT 34 17 - 63 U/L   Alkaline Phosphatase 82 38 - 126 U/L   Total Bilirubin  0.8 0.3 - 1.2 mg/dL   GFR calc non Af Amer >60 >60 mL/min   GFR calc Af Amer >60 >60 mL/min   Anion gap 11 5 - 15  CBC with Differential/Platelet  Result Value Ref Range   WBC 14.7 (H) 4.0 - 10.5 K/uL   RBC 4.96 4.22 - 5.81 MIL/uL   Hemoglobin 13.7 13.0 - 17.0 g/dL   HCT 65.742.3 84.639.0 - 96.252.0 %   MCV 85.3 78.0 - 100.0 fL   MCH 27.6 26.0 - 34.0 pg   MCHC 32.4 30.0 - 36.0 g/dL   RDW 95.214.6 84.111.5 - 32.415.5 %   Platelets 408 (H) 150 - 400 K/uL   Neutrophils Relative % 80 %   Neutro Abs 11.7 (H) 1.7 - 7.7 K/uL   Lymphocytes Relative 15 %   Lymphs Abs 2.2 0.7 - 4.0 K/uL   Monocytes Relative 5 %   Monocytes Absolute 0.7 0.1 - 1.0 K/uL   Eosinophils Relative 0 %   Eosinophils Absolute 0.0 0.0 - 0.7 K/uL   Basophils Relative 0 %   Basophils Absolute 0.0 0.0 - 0.1 K/uL  Urine microscopic-add on  Result Value Ref Range   Squamous Epithelial / LPF 0-5 (A) NONE SEEN   WBC, UA 0-5 0 - 5 WBC/hpf   RBC / HPF 6-30 0 - 5 RBC/hpf   Bacteria, UA FEW (A) NONE SEEN   Urine-Other MUCOUS PRESENT    Laboratory interpretation all normal except For hematuria and borderline increased white cells with bacteria, leukocytosis     Radiology Ct Renal Stone Study  Result Date: 09/09/2016 CLINICAL DATA:  Left flank pain, onset at 15:00 today. EXAM: CT ABDOMEN AND PELVIS WITHOUT  CONTRAST TECHNIQUE: Multidetector CT imaging of the abdomen and pelvis was performed following the standard protocol without IV contrast. COMPARISON:  None. FINDINGS: Lower chest: No acute abnormality. Hepatobiliary: Diffuse fatty infiltration of the liver. No focal liver lesions. Gallbladder and bile ducts appear unremarkable. Pancreas: Unremarkable. No pancreatic ductal dilatation or surrounding inflammatory changes. Spleen: Normal in size without focal abnormality. Adrenals/Urinary Tract: Both adrenals are normal. There is an obstructing 7 x 7 x 11 mm calculus at the left ureteropelvic junction with moderate hydronephrosis. There is a 3 mm upper pole left renal collecting system calculus posteriorly. No right-sided urinary calculi. Urinary bladder is unremarkable. Stomach/Bowel: Stomach is within normal limits. Appendix appears normal. No evidence of bowel wall thickening, distention, or inflammatory changes. Vascular/Lymphatic: No significant vascular findings are present. No enlarged abdominal or pelvic lymph nodes. Reproductive: Unremarkable Other: Fat containing umbilical hernia.  No ascites. Musculoskeletal: No acute or significant osseous findings. IMPRESSION: 1. Obstructing 7 x 7 x 11 mm left UPJ calculus with moderate hydronephrosis. 2. Upper pole left nephrolithiasis. 3. Fat containing umbilical hernia. 4. Fatty liver. Electronically Signed   By: Ellery Plunkaniel R Mitchell M.D.   On: 09/09/2016 00:12    Procedures Procedures (including critical care time)  Medications Ordered in ED Medications  0.9 %  sodium chloride infusion ( Intravenous Stopped 09/09/16 0413)  fentaNYL (SUBLIMAZE) injection 50 mcg (50 mcg Intravenous Given 09/09/16 0011)  ondansetron (ZOFRAN) injection 4 mg (4 mg Intravenous Given 09/09/16 0010)  HYDROmorphone (DILAUDID) injection 1 mg (1 mg Intravenous Given 09/09/16 0043)  ciprofloxacin (CIPRO) tablet 500 mg (500 mg Oral Given 09/09/16 0413)   Initial Impression / Assessment  and Plan / ED Course  I have reviewed the triage vital signs and the nursing notes.  Pertinent labs & imaging results that were available during  my care of the patient were reviewed by me and considered in my medical decision making (see chart for details).  Clinical Course    COORDINATION OF CARE: 11:34 PM Discussed treatment plan with pt at bedside and pt agreed to plan.CT scan was ordered to look for possible renal stone. He was given fentanyl for pain.  When patient returned from CT scan nurse reports his pain is worse and he appears much more uncomfortable. He was given Dilaudid IV.  12:54 AM Pt reports dilaudid is helping with his pain. Discussed CT results with pt and advised to follow up with Urology. Pt is agreeable to plan.   2:29 AM Upon reevaluation, pt is feeling much better and only has pain on occasion. Pt is aware of pending urinalysis and agrees to treatment plan.    After reviewing his urinalysis he was started on Cipro. Patient was given the results of his CT scan and the need to follow-up with urology. His pain was well controlled in the ED and his kidney function is normal. He can be followed by urology as an outpatient.  Final Clinical Impressions(s) / ED Diagnoses   Final diagnoses:  Left flank pain  Left ureteral stone    New Prescriptions Discharge Medication List as of 09/09/2016  3:31 AM    START taking these medications   Details  ciprofloxacin (CIPRO) 500 MG tablet Take 1 tablet (500 mg total) by mouth 2 (two) times daily., Starting Wed 09/09/2016, Print    ondansetron (ZOFRAN) 4 MG tablet Take 1 tablet (4 mg total) by mouth every 8 (eight) hours as needed for nausea or vomiting., Starting Wed 09/09/2016, Print    PERCOCET 5-325 MG tablet Take 1-2 tablets by mouth every 6 (six) hours as needed for severe pain., Starting Wed 09/09/2016, Print    tamsulosin (FLOMAX) 0.4 MG CAPS capsule Take 1 po QD until you pass the stone., Print       Plan  discharge  Tristan Albe, MD, FACEP  I personally performed the services described in this documentation, which was scribed in my presence. The recorded information has been reviewed and considered.  Tristan Albe, MD, Concha Pyo, MD 09/09/16 [phone]

## 2016-09-09 LAB — COMPREHENSIVE METABOLIC PANEL
ALBUMIN: 4.3 g/dL (ref 3.5–5.0)
ALK PHOS: 82 U/L (ref 38–126)
ALT: 34 U/L (ref 17–63)
AST: 27 U/L (ref 15–41)
Anion gap: 11 (ref 5–15)
BUN: 18 mg/dL (ref 6–20)
CALCIUM: 9.5 mg/dL (ref 8.9–10.3)
CO2: 21 mmol/L — AB (ref 22–32)
CREATININE: 0.83 mg/dL (ref 0.61–1.24)
Chloride: 106 mmol/L (ref 101–111)
GFR calc Af Amer: >60 mL/min (ref >60)
GFR calc non Af Amer: >60 mL/min (ref >60)
GLUCOSE: 128 mg/dL — AB (ref 65–99)
Potassium: 4.3 mmol/L (ref 3.5–5.1)
SODIUM: 138 mmol/L (ref 135–145)
Total Bilirubin: 0.8 mg/dL (ref 0.3–1.2)
Total Protein: 7.7 g/dL (ref 6.5–8.1)

## 2016-09-09 LAB — CBC WITH DIFFERENTIAL/PLATELET
BASOS PCT: 0 %
Basophils Absolute: 0 10*3/uL (ref 0.0–0.1)
EOS ABS: 0 10*3/uL (ref 0.0–0.7)
Eosinophils Relative: 0 %
HEMATOCRIT: 42.3 % (ref 39.0–52.0)
Hemoglobin: 13.7 g/dL (ref 13.0–17.0)
Lymphocytes Relative: 15 %
Lymphs Abs: 2.2 10*3/uL (ref 0.7–4.0)
MCH: 27.6 pg (ref 26.0–34.0)
MCHC: 32.4 g/dL (ref 30.0–36.0)
MCV: 85.3 fL (ref 78.0–100.0)
MONO ABS: 0.7 10*3/uL (ref 0.1–1.0)
MONOS PCT: 5 %
NEUTROS ABS: 11.7 10*3/uL — AB (ref 1.7–7.7)
Neutrophils Relative %: 80 %
Platelets: 408 10*3/uL — ABNORMAL HIGH (ref 150–400)
RBC: 4.96 MIL/uL (ref 4.22–5.81)
RDW: 14.6 % (ref 11.5–15.5)
WBC: 14.7 10*3/uL — ABNORMAL HIGH (ref 4.0–10.5)

## 2016-09-09 LAB — URINALYSIS, ROUTINE W REFLEX MICROSCOPIC
Glucose, UA: NEGATIVE mg/dL
Ketones, ur: 15 mg/dL — AB
Leukocytes, UA: NEGATIVE
NITRITE: NEGATIVE
Protein, ur: NEGATIVE mg/dL
SPECIFIC GRAVITY, URINE: 1.026 (ref 1.005–1.030)
pH: 6 (ref 5.0–8.0)

## 2016-09-09 LAB — URINE MICROSCOPIC-ADD ON

## 2016-09-09 MED ORDER — ONDANSETRON HCL 4 MG PO TABS: 4.0000 mg | ORAL_TABLET | Freq: Three times a day (TID) | ORAL | 0 refills | Status: DC | PRN

## 2016-09-09 MED ORDER — PERCOCET 5-325 MG PO TABS: 1.0000 | ORAL_TABLET | Freq: Four times a day (QID) | ORAL | 0 refills | Status: DC | PRN

## 2016-09-09 MED ORDER — TAMSULOSIN HCL 0.4 MG PO CAPS: ORAL_CAPSULE | ORAL | 0 refills | Status: DC

## 2016-09-09 MED ORDER — CIPROFLOXACIN HCL 500 MG PO TABS: 500.0000 mg | ORAL_TABLET | Freq: Two times a day (BID) | ORAL | 0 refills | Status: DC

## 2016-09-09 MED ORDER — CIPROFLOXACIN HCL 500 MG PO TABS
500.0000 mg | ORAL_TABLET | Freq: Once | ORAL | Status: AC
  Administered 2016-09-09: 500 mg via ORAL
  Filled 2016-09-09: qty 1

## 2016-09-09 MED ORDER — HYDROMORPHONE HCL 1 MG/ML IJ SOLN
1.0000 mg | Freq: Once | INTRAMUSCULAR | Status: AC
  Administered 2016-09-09: 1 mg via INTRAVENOUS
  Filled 2016-09-09: qty 1

## 2016-09-09 NOTE — Discharge Instructions (Signed)
Drink plenty of fluids. Take the medications as prescribed. Call [facility] this morning to be seen this afternoon. You have a "7x7x3011mm stone in the UPJ". Tell them I felt you were going to have trouble passing this stone because of it's size and it is stuck high in the ureter (tube that drains urine from the kidney).  They should give you an appointment for this after noon. Return to the ED if you get a fever, or have uncontrolled vomiting or pain.

## 2016-09-09 NOTE — ED Notes (Signed)
Redrew a CBC and sent to main lab.

## 2016-09-10 ENCOUNTER — Other Ambulatory Visit: Payer: Self-pay | Admitting: Urology

## 2016-09-11 ENCOUNTER — Encounter (HOSPITAL_COMMUNITY): Payer: Self-pay | Admitting: *Deleted

## 2016-09-11 NOTE — H&P (Signed)
Office Visit Report     09/09/2016   --------------------------------------------------------------------------------   Tristan Perez  MRN: [id number]  PRIMARY CARE:  Anna GenreKevin L. Little, MD  DOB: January 29, 1971, 45 year old Male  REFERRING:  Ward GivensIva L. Knapp, MD  SSN: -[id number]  PROVIDER:  Heloise PurpuraLester Zakayla Martinec, M.D.    LOCATION:  [facility] [phone]   --------------------------------------------------------------------------------   CC/HPI: Left proximal ureteral stone    Mr. Tristan Perez is a 45 year old gentleman who developed the acute onset of severe left-sided flank pain on Sunday. This relented and then recurred yesterday and became more severe prompting him to go to the emergency department. He underwent a CT scan of the abdomen and pelvis that confirmed an 11 mm x 7 mm proximal left ureteral stone. He has had nausea but no vomiting. He denies any fever. He denies hematuria. He denies a prior history of stones. He does have significant obesity.   He was prescribed Percocet and tamsulosin. His pain has been controlled today.     ALLERGIES: Caffeine    MEDICATIONS: None   GU PSH: None   NON-GU PSH: None   GU PMH: None   NON-GU PMH: GERD Hypertension    FAMILY HISTORY: 1 Daughter - Runs in Family Diabetes - Father Prostate Cancer - Grandfather stroke - Grandmother   SOCIAL HISTORY: Marital Status: Single Current Smoking Status: Patient has never smoked.  Has never drank.  Does not drink caffeine.    REVIEW OF SYSTEMS:    GU Review Male:   Patient reports frequent urination. Patient denies hard to postpone urination, burning/ pain with urination, leakage of urine, stream starts and stops, trouble starting your streams, and have to strain to urinate .  Gastrointestinal (Upper):   Patient denies nausea and vomiting.  Gastrointestinal (Lower):   Patient denies diarrhea and constipation.  Constitutional:   Patient denies fever, night sweats, weight loss, and  fatigue.  Skin:   Patient denies skin rash/ lesion and itching.  Eyes:   Patient denies blurred vision and double vision.  Ears/ Nose/ Throat:   Patient denies sinus problems and sore throat.  Hematologic/Lymphatic:   Patient denies swollen glands and easy bruising.  Cardiovascular:   Patient denies leg swelling and chest pains.  Respiratory:   Patient denies cough and shortness of breath.  Endocrine:   Patient denies excessive thirst.  Musculoskeletal:   Patient denies back pain and joint pain.  Neurological:   Patient denies headaches and dizziness.  Psychologic:   Patient denies depression and anxiety.   VITAL SIGNS:      11 /15/2017 02:49 PM  Weight 390 lb / 176.9 kg  Height 72 in / 182.88 cm  BP 149/86 mmHg  Pulse 106 /min  BMI 52.9 kg/m   MULTI-SYSTEM PHYSICAL EXAMINATION:    Constitutional: Well-nourished. No physical deformities. Normally developed. Good grooming.  Neck: Neck symmetrical, not swollen. Normal tracheal position.  Respiratory: No labored breathing, no use of accessory muscles. Clear bilaterally.  Cardiovascular: Normal temperature, normal extremity pulses, no swelling, no varicosities. Regular rate and rhythm.  Lymphatic: No enlargement of neck, axillae, groin.  Skin: No paleness, no jaundice, no cyanosis. No lesion, no ulcer, no rash.  Neurologic / Psychiatric: Oriented to time, oriented to place, oriented to person. No depression, no anxiety, no agitation.  Gastrointestinal: Obese. No tenderness.  Eyes: Normal conjunctivae. Normal eyelids.  Ears, Nose, Mouth, and Throat: Left ear no scars, no lesions, no masses. Right ear no scars, no lesions, no masses.  Nose no scars, no lesions, no masses. Normal hearing. Normal lips.  Musculoskeletal: Normal gait and station of head and neck.     PAST DATA REVIEWED:  Source Of History:  Patient  Records Review:   Previous Patient Records  Urine Test Review:   Urinalysis  X-Ray Review: C.T. Abdomen/Pelvis: Reviewed  Films.    Notes:                     Renal function was normal in the emergency department.I think my head of the water without think it's orifice and traversed dictating, Verner CholRoderick second there is no spreading the patient is a thanks so much  A great job thank you   PROCEDURES:          Urinalysis w/Scope Dipstick Dipstick Cont'd Micro  Color: Yellow Bilirubin: Neg WBC/hpf: 0 - 5/hpf  Appearance: Clear Ketones: Neg RBC/hpf: 0 - 2/hpf  Specific Gravity: 1.020 Blood: 1+ Bacteria: NS (Not Seen)  pH: 6.0 Protein: Neg Cystals: NS (Not Seen)  Glucose: Neg Urobilinogen: 0.2 Casts: NS (Not Seen)    Nitrites: Neg Trichomonas: Not Present    Leukocyte Esterase: Neg Mucous: Present      Epithelial Cells: 0 - 5/hpf      Yeast: NS (Not Seen)      Sperm: Not Present    ASSESSMENT:      ICD-10 Details  1 GU:   Calculus Ureter - N20.1    PLAN:           Schedule Return Visit: Other See Visit Notes             Note: Will call to schedule surgery          Document Letter(s):  Created for Patient: Clinical Summary         Notes:   1. Left proximal ureteral stone: We discussed that his stone is a very low chance of passing spontaneously considering the size of the stone. We reviewed options for definitive treatment including shockwave lithotripsy, ureteroscopic laser lithotripsy, and percutaneous nephrolithotomy. After reviewing the pros and cons of each approach, and considering his significant obesity, I recommended proceeding with ureteroscopic laser lithotripsy. We reviewed this procedure in detail including the potential risks, consultations, and the expected recovery process as well as the need for a probable postoperative stent. All questions were answered to his stated satisfaction. He gives informed consent to proceed. This will be scheduled shortly after the Thanksgiving holiday at his request as he does have some vacation time around the weekend after Thanksgiving.   Cc: Dr. Catha GosselinKevin Little     * Signed by Heloise PurpuraLester Evangelia Whitaker, M.D. on 09/09/16 at 8:13 PM (EST)*

## 2016-09-13 MED ORDER — DEXTROSE 5 % IV SOLN
3.0000 g | INTRAVENOUS | Status: AC
  Administered 2016-09-14: 3 g via INTRAVENOUS
  Filled 2016-09-13: qty 3

## 2016-09-14 ENCOUNTER — Ambulatory Visit (HOSPITAL_COMMUNITY): Payer: Managed Care, Other (non HMO) | Admitting: Anesthesiology

## 2016-09-14 ENCOUNTER — Encounter (HOSPITAL_COMMUNITY): Payer: Self-pay | Admitting: *Deleted

## 2016-09-14 ENCOUNTER — Encounter (HOSPITAL_COMMUNITY)
Admission: RE | Disposition: A | Discharge status: Home / Self Care | Payer: Self-pay | Source: Ambulatory Visit | Attending: Urology

## 2016-09-14 ENCOUNTER — Ambulatory Visit (HOSPITAL_COMMUNITY)
Admission: RE | Admit: 2016-09-14 | Discharge: 2016-09-14 | Disposition: A | Discharge status: Home / Self Care | Payer: Managed Care, Other (non HMO) | Source: Ambulatory Visit | Attending: Urology | Admitting: Urology

## 2016-09-14 DIAGNOSIS — Z8042 Family history of malignant neoplasm of prostate: Secondary | ICD-10-CM | POA: Diagnosis not present

## 2016-09-14 DIAGNOSIS — Z823 Family history of stroke: Secondary | ICD-10-CM | POA: Diagnosis not present

## 2016-09-14 DIAGNOSIS — N201 Calculus of ureter: Secondary | ICD-10-CM | POA: Diagnosis present

## 2016-09-14 DIAGNOSIS — I10 Essential (primary) hypertension: Secondary | ICD-10-CM | POA: Insufficient documentation

## 2016-09-14 DIAGNOSIS — Z91048 Other nonmedicinal substance allergy status: Secondary | ICD-10-CM | POA: Diagnosis not present

## 2016-09-14 DIAGNOSIS — Z6841 Body Mass Index (BMI) 40.0 and over, adult: Secondary | ICD-10-CM | POA: Insufficient documentation

## 2016-09-14 DIAGNOSIS — Z833 Family history of diabetes mellitus: Secondary | ICD-10-CM | POA: Insufficient documentation

## 2016-09-14 HISTORY — PX: CYSTOSCOPY/URETEROSCOPY/HOLMIUM LASER/STENT PLACEMENT: SHX6546

## 2016-09-14 HISTORY — DX: Essential (primary) hypertension: I10

## 2016-09-14 HISTORY — DX: Personal history of urinary (tract) infections: Z87.440

## 2016-09-14 HISTORY — DX: Personal history of urinary calculi: Z87.442

## 2016-09-14 LAB — BASIC METABOLIC PANEL
ANION GAP: 13 (ref 5–15)
BUN: 17 mg/dL (ref 6–20)
CHLORIDE: 102 mmol/L (ref 101–111)
CO2: 21 mmol/L — AB (ref 22–32)
CREATININE: 1.06 mg/dL (ref 0.61–1.24)
Calcium: 9.3 mg/dL (ref 8.9–10.3)
GFR calc non Af Amer: >60 mL/min (ref >60)
GLUCOSE: 110 mg/dL — AB (ref 65–99)
Potassium: 3.5 mmol/L (ref 3.5–5.1)
Sodium: 136 mmol/L (ref 135–145)

## 2016-09-14 LAB — CBC
HCT: 43.3 % (ref 39.0–52.0)
HEMOGLOBIN: 14.1 g/dL (ref 13.0–17.0)
MCH: 27.7 pg (ref 26.0–34.0)
MCHC: 32.6 g/dL (ref 30.0–36.0)
MCV: 85.1 fL (ref 78.0–100.0)
Platelets: 357 10*3/uL (ref 150–400)
RBC: 5.09 MIL/uL (ref 4.22–5.81)
RDW: 14.3 % (ref 11.5–15.5)
WBC: 9.2 10*3/uL (ref 4.0–10.5)

## 2016-09-14 LAB — TYPE AND SCREEN
ABO/RH(D): A POS
Antibody Screen: NEGATIVE

## 2016-09-14 LAB — ABO/RH: ABO/RH(D): A POS

## 2016-09-14 SURGERY — CYSTOSCOPY/URETEROSCOPY/HOLMIUM LASER/STENT PLACEMENT
Anesthesia: General | Laterality: Left

## 2016-09-14 MED ORDER — SUCCINYLCHOLINE CHLORIDE 20 MG/ML IJ SOLN
INTRAMUSCULAR | Status: DC | PRN
  Administered 2016-09-14: 160 mg via INTRAVENOUS
  Administered 2016-09-14: 20 mg via INTRAVENOUS

## 2016-09-14 MED ORDER — LACTATED RINGERS IV SOLN
INTRAVENOUS | Status: DC
  Administered 2016-09-14: 1000 mL via INTRAVENOUS
  Administered 2016-09-14: 14:00:00 via INTRAVENOUS

## 2016-09-14 MED ORDER — FENTANYL CITRATE (PF) 100 MCG/2ML IJ SOLN
INTRAMUSCULAR | Status: AC
  Filled 2016-09-14: qty 2

## 2016-09-14 MED ORDER — PROPOFOL 10 MG/ML IV BOLUS
INTRAVENOUS | Status: DC | PRN
  Administered 2016-09-14: 60 mg via INTRAVENOUS
  Administered 2016-09-14: 200 mg via INTRAVENOUS
  Administered 2016-09-14: 60 mg via INTRAVENOUS

## 2016-09-14 MED ORDER — IOHEXOL 300 MG/ML  SOLN
INTRAMUSCULAR | Status: DC | PRN
  Administered 2016-09-14: 7 mL

## 2016-09-14 MED ORDER — OXYCODONE-ACETAMINOPHEN 5-325 MG PO TABS: 1.0000 | ORAL_TABLET | ORAL | 0 refills | Status: DC | PRN

## 2016-09-14 MED ORDER — DEXAMETHASONE SODIUM PHOSPHATE 10 MG/ML IJ SOLN
INTRAMUSCULAR | Status: DC | PRN
  Administered 2016-09-14: 10 mg via INTRAVENOUS

## 2016-09-14 MED ORDER — MIDAZOLAM HCL 5 MG/5ML IJ SOLN
INTRAMUSCULAR | Status: DC | PRN
  Administered 2016-09-14: 2 mg via INTRAVENOUS

## 2016-09-14 MED ORDER — FENTANYL CITRATE (PF) 100 MCG/2ML IJ SOLN: 25.0000 ug | INTRAMUSCULAR | Status: DC | PRN

## 2016-09-14 MED ORDER — SODIUM CHLORIDE 0.9 % IR SOLN
Status: DC | PRN
  Administered 2016-09-14: 3000 mL

## 2016-09-14 MED ORDER — PROMETHAZINE HCL 25 MG/ML IJ SOLN: 6.2500 mg | INTRAMUSCULAR | Status: DC | PRN

## 2016-09-14 MED ORDER — OXYCODONE-ACETAMINOPHEN 5-325 MG PO TABS
1.0000 | ORAL_TABLET | ORAL | Status: DC | PRN
  Administered 2016-09-14: 1 via ORAL
  Filled 2016-09-14: qty 1

## 2016-09-14 MED ORDER — PHENYLEPHRINE HCL 10 MG/ML IJ SOLN
INTRAMUSCULAR | Status: DC | PRN
  Administered 2016-09-14 (×3): 80 ug via INTRAVENOUS

## 2016-09-14 MED ORDER — FENTANYL CITRATE (PF) 100 MCG/2ML IJ SOLN
INTRAMUSCULAR | Status: DC | PRN
  Administered 2016-09-14: 100 ug via INTRAVENOUS
  Administered 2016-09-14: 50 ug via INTRAVENOUS

## 2016-09-14 MED ORDER — PROPOFOL 10 MG/ML IV BOLUS
INTRAVENOUS | Status: AC
  Filled 2016-09-14: qty 40

## 2016-09-14 MED ORDER — LIDOCAINE 2% (20 MG/ML) 5 ML SYRINGE
INTRAMUSCULAR | Status: DC | PRN
  Administered 2016-09-14: 80 mg via INTRAVENOUS

## 2016-09-14 MED ORDER — MIDAZOLAM HCL 2 MG/2ML IJ SOLN
INTRAMUSCULAR | Status: AC
  Filled 2016-09-14: qty 2

## 2016-09-14 MED ORDER — SODIUM CHLORIDE 0.9 % IR SOLN
Status: DC | PRN
  Administered 2016-09-14: 2000 mL

## 2016-09-14 SURGICAL SUPPLY — 24 items
BAG URO CATCHER STRL LF (MISCELLANEOUS) ×3 IMPLANT
BASKET ZERO TIP 1.9FR (BASKET) ×3 IMPLANT
BASKET ZERO TIP NITINOL 2.4FR (BASKET) IMPLANT
BOSTON SCIENTIFIC ×3 IMPLANT
BSKT STON RTRVL ZERO TP 1.9FR (BASKET) ×1
BSKT STON RTRVL ZERO TP 2.4FR (BASKET)
CATH INTERMIT  6FR 70CM (CATHETERS) ×3 IMPLANT
CLOTH BEACON ORANGE TIMEOUT ST (SAFETY) ×3 IMPLANT
FIBER LASER FLEXIVA 365 (UROLOGICAL SUPPLIES) IMPLANT
FIBER LASER TRAC TIP (UROLOGICAL SUPPLIES) ×3 IMPLANT
GLOVE BIOGEL M STRL SZ7.5 (GLOVE) ×3 IMPLANT
GOWN STRL REUS W/TWL LRG LVL3 (GOWN DISPOSABLE) ×6 IMPLANT
GUIDEWIRE ANG ZIPWIRE 038X150 (WIRE) IMPLANT
GUIDEWIRE STR DUAL SENSOR (WIRE) ×3 IMPLANT
IV NS 1000ML (IV SOLUTION)
IV NS 1000ML BAXH (IV SOLUTION) IMPLANT
KIT BALLN UROMAX 15FX4 (MISCELLANEOUS) ×1 IMPLANT
KIT BALLN UROMAX 26 75X4 (MISCELLANEOUS) ×2
MANIFOLD NEPTUNE II (INSTRUMENTS) ×3 IMPLANT
PACK CYSTO (CUSTOM PROCEDURE TRAY) ×3 IMPLANT
SHEATH ACCESS URETERAL 38CM (SHEATH) ×3 IMPLANT
STENT CONTOUR 6FRX26X.038 (STENTS) ×3 IMPLANT
TUBING CONNECTING 10 (TUBING) ×2 IMPLANT
TUBING CONNECTING 10' (TUBING) ×1

## 2016-09-14 NOTE — Transfer of Care (Signed)
Immediate Anesthesia Transfer of Care Note  Patient: Tristan Perez  Procedure(s) Performed: Procedure(s): CYSTOSCOPY/RETROGRADES/URETEROSCOPY/HOLMIUM LASER/STENT PLACEMENT (Left)  Patient Location: PACU  Anesthesia Type:General  Level of Consciousness: awake, alert  and oriented  Airway & Oxygen Therapy: Patient Spontanous Breathing and Patient connected to face mask oxygen  Post-op Assessment: Report given to RN and Post -op Vital signs reviewed and stable  Post vital signs: Reviewed and stable  Last Vitals:  Vitals:   09/14/16 1139  BP: (!) 176/96  Pulse: (!) 102  Resp: 20  Temp: 37.2 C    Last Pain:  Vitals:   09/14/16 1254  TempSrc:   PainSc: 2       Patients Stated Pain Goal: 4 (09/14/16 1254)  Complications: No apparent anesthesia complications

## 2016-09-14 NOTE — OR Nursing (Signed)
Stone taken by Dr. Borden 

## 2016-09-14 NOTE — Op Note (Signed)
Preoperative diagnosis: Left proximal ureteral calculus  Postoperative diagnosis: Left proximal ureteral calculus  Procedure:  1. Cystoscopy 2. Left ureteroscopy and stone removal 3. Ureteroscopic laser lithotripsy 4. Left ureteral stent placement (6 x 26 - no string) 5. Left retrograde pyelography with interpretation 6. Urethral dilation 7. Dilation of distal left ureter  Surgeon: Moody BruinsLester S. Lliam Hoh, Jr. M.D.  Anesthesia: General  Complications: None  Intraoperative findings: Left retrograde pyelography demonstrated a filling defect within the proximal left ureter consistent with the patient's known calculus without other abnormalities noted.  EBL: Minimal  Specimens: 1. Left ureteral calculus  Disposition of specimens: [facility] for stone analysis  Indication: Tristan Perez  is a 45 y.o. patient with an 11 x 8 mm left proximal ureteral stone. After reviewing the management options for treatment, they elected to proceed with the above surgical procedure(s). We have discussed the potential benefits and risks of the procedure, side effects of the proposed treatment, the likelihood of the patient achieving the goals of the procedure, and any potential problems that might occur during the procedure or recuperation. Informed consent has been obtained.  Description of procedure:  The patient was taken to the operating room and general anesthesia was induced.  The patient was placed in the dorsal lithotomy position, prepped and draped in the usual sterile fashion, and preoperative antibiotics were administered. A preoperative time-out was performed.   Cystourethroscopy was performed.  The urethral meatus required serial dilation with Sissy HoffVan Buren sounds up to 28 Fr to allow the cystoscope to pass into the urethra.  The patient's urethra was examined and was normal. The bladder was then systematically examined in its entirety. There was no evidence for any bladder  tumors, stones, or other mucosal pathology.    Attention then turned to the left ureteral orifice and a ureteral catheter was used to intubate the ureteral orifice.  Omnipaque contrast was injected through the ureteral catheter and a retrograde pyelogram was performed with findings as dictated above.  A 0.38 sensor guidewire was then advanced up the left ureter into the renal pelvis under fluoroscopic guidance.  A 12/14 Fr ureteral access sheath was then advanced over the guide wire. Initially the ureter would not allow the access sheath to pass even when just the inner sheath was used.  It was not forced and the decision was made to perform ureteral dilation.  The 4 cm ureteral balloon dilator was used to dilate the distal left ureter.  The access sheath was then able to be passed into the ureter without resistance. The digital flexible ureteroscope was then advanced through the access sheath into the ureter next to the guidewire and the calculus was identified and was located in the proximal left ureter.   The stone was then fragmented with the 200 micron holmium laser fiber on a setting of 0.6 J and frequency of 6 Hz.  The stone was very hard and required extensive lithotripsy.   All sizable stones were then removed with a zero tip nitinol basket.  Reinspection of the ureter/renal pelvis revealed no remaining visible stones or fragments of significant size.   The safety wire was then replaced and the access sheath removed.  The guidewire was backloaded through the cystoscope and a ureteral stent was advance over the wire using Seldinger technique.  The stent was positioned appropriately under fluoroscopic and cystoscopic guidance.  The wire was then removed with an adequate stent curl noted in the renal pelvis as well as in the bladder.  The bladder was then emptied and the procedure ended.  The patient appeared to tolerate the procedure well and without complications.  The patient was able to be  awakened and transferred to the recovery unit in satisfactory condition.   Moody BruinsLester S. Willene Holian, Jr. MD

## 2016-09-14 NOTE — Anesthesia Preprocedure Evaluation (Addendum)
Anesthesia Evaluation  Patient identified by MRN, date of birth, ID band Patient awake    Reviewed: Allergy & Precautions, NPO status , Patient's Chart, lab work & pertinent test results  History of Anesthesia Complications Negative for: history of anesthetic complications  Airway Mallampati: III  TM Distance: >3 FB Neck ROM: Full    Dental  (+) Teeth Intact, Dental Advisory Given   Pulmonary neg pulmonary ROS, neg shortness of breath, neg sleep apnea (negative sleep study x2), neg COPD, neg recent URI,    Pulmonary exam normal breath sounds clear to auscultation       Cardiovascular hypertension, (-) angina(-) Past MI, (-) Cardiac Stents, (-) CABG, (-) Peripheral Vascular Disease, (-) Orthopnea, (-) PND and (-) DOE (-) dysrhythmias  Rhythm:Regular Rate:Tachycardia  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   Neuro/Psych negative neurological ROS     GI/Hepatic negative GI ROS, Neg liver ROS,   Endo/Other  neg diabetesMorbid obesity  Renal/GU Renal disease (nephrolithiasis)     Musculoskeletal   Abdominal (+) + obese,   Peds  Hematology negative hematology ROS (+)   Anesthesia Other Findings   Reproductive/Obstetrics                            Anesthesia Physical Anesthesia Plan  ASA: III  Anesthesia Plan: General   Post-op Pain Management:    Induction: Intravenous  Airway Management Planned: Oral ETT and Video Laryngoscope Planned  Additional Equipment:   Intra-op Plan:   Post-operative Plan: Extubation in OR  Informed Consent: I have reviewed the patients History and Physical, chart, labs and discussed the procedure including the risks, benefits and alternatives for the proposed anesthesia with the patient or authorized representative who has indicated his/her understanding and acceptance.   Dental advisory given  Plan Discussed with: CRNA  Anesthesia Plan  Comments: (Risks of general anesthesia discussed including, but not limited to, sore throat, hoarse voice, chipped/damaged teeth, injury to vocal cords, nausea and vomiting, allergic reactions, lung infection, heart attack, stroke, and death. All questions answered. )       Anesthesia Quick Evaluation

## 2016-09-14 NOTE — Anesthesia Procedure Notes (Signed)
Procedure Name: Intubation Performed by: Kizzie FantasiaARVER, Zyliah Schier J Pre-anesthesia Checklist: Patient identified, Emergency Drugs available, Suction available, Patient being monitored and Timeout performed Patient Re-evaluated:Patient Re-evaluated prior to inductionOxygen Delivery Method: Circle system utilized Preoxygenation: Pre-oxygenation with 100% oxygen Intubation Type: IV induction Ventilation: Mask ventilation without difficulty Laryngoscope Size: Mac and 4 Grade View: Grade III Tube type: Oral Tube size: 7.5 mm Number of attempts: 1 Airway Equipment and Method: Stylet Placement Confirmation: ETT inserted through vocal cords under direct vision,  positive ETCO2,  CO2 detector and breath sounds checked- equal and bilateral Secured at: 23 cm Tube secured with: Tape Dental Injury: Teeth and Oropharynx as per pre-operative assessment  Difficulty Due To: Difficulty was anticipated Future Recommendations: Recommend- induction with short-acting agent, and alternative techniques readily available

## 2016-09-14 NOTE — Interval H&P Note (Signed)
History and Physical Interval Note:  09/14/2016 12:53 PM  Tristan EarthlyDouglas M Mikula  has presented today for surgery, with the diagnosis of LEFT PROXIMAL URETERAL STONE  The various methods of treatment have been discussed with the patient and family. After consideration of risks, benefits and other options for treatment, the patient has consented to  Procedure(s): CYSTOSCOPY/URETEROSCOPY/HOLMIUM LASER/STENT PLACEMENT (Left) as a surgical intervention .  The patient's history has been reviewed, patient examined, no change in status, stable for surgery.  I have reviewed the patient's chart and labs.  Questions were answered to the patient's satisfaction.     Eugene Isadore,LES

## 2016-09-14 NOTE — Discharge Instructions (Addendum)
General Anesthesia, Adult, Care After °These instructions provide you with information about caring for yourself after your procedure. Your health care provider may also give you more specific instructions. Your treatment has been planned according to current medical practices, but problems sometimes occur. Call your health care provider if you have any problems or questions after your procedure. °What can I expect after the procedure? °After the procedure, it is common to have: °Vomiting. °A sore throat. °Mental slowness. °It is common to feel: °Nauseous. °Cold or shivery. °Sleepy. °Tired. °Sore or achy, even in parts of your body where you did not have surgery. °Follow these instructions at home: °For at least 24 hours after the procedure:  °Do not: °Participate in activities where you could fall or become injured. °Drive. °Use heavy machinery. °Drink alcohol. °Take sleeping pills or medicines that cause drowsiness. °Make important decisions or sign legal documents. °Take care of children on your own. °Rest. °Eating and drinking  °If you vomit, drink water, juice, or soup when you can drink without vomiting. °Drink enough fluid to keep your urine clear or pale yellow. °Make sure you have little or no nausea before eating solid foods. °Follow the diet recommended by your health care provider. °General instructions  °Have a responsible adult stay with you until you are awake and alert. °Return to your normal activities as told by your health care provider. Ask your health care provider what activities are safe for you. °Take over-the-counter and prescription medicines only as told by your health care provider. °If you smoke, do not smoke without supervision. °Keep all follow-up visits as told by your health care provider. This is important. °Contact a health care provider if: °You continue to have nausea or vomiting at home, and medicines are not helpful. °You cannot drink fluids or start eating again. °You cannot  urinate after 8-12 hours. °You develop a skin rash. °You have fever. °You have increasing redness at the site of your procedure. °Get help right away if: °You have difficulty breathing. °You have chest pain. °You have unexpected bleeding. °You feel that you are having a life-threatening or urgent problem. °This information is not intended to replace advice given to you by your health care provider. Make sure you discuss any questions you have with your health care provider. °Document Released: 01/18/2001 Document Revised: 03/16/2016 Document Reviewed: 09/26/2015 °Elsevier Interactive Patient Education © 2017 [employer] ° ° ° ° °1. You may see some blood in the urine and may have some burning with urination for 48-72 hours. You also may notice that you have to urinate more frequently or urgently after your procedure which is normal.  °2. You should call should you develop an inability urinate, fever > 101, persistent nausea and vomiting that prevents you from eating or drinking to stay hydrated.  °3. If you have a stent, you will likely urinate more frequently and urgently until the stent is removed and you may experience some discomfort/pain in the lower abdomen and flank especially when urinating. You may take pain medication prescribed to you if needed for pain. You may also intermittently have blood in the urine until the stent is removed. ° ° °

## 2016-09-14 NOTE — Anesthesia Postprocedure Evaluation (Signed)
Anesthesia Post Note  Patient: Tristan Perez  Procedure(s) Performed: Procedure(s) (LRB): CYSTOSCOPY/RETROGRADES/URETEROSCOPY/HOLMIUM LASER/STENT PLACEMENT (Left)  Patient location during evaluation: PACU Anesthesia Type: General Level of consciousness: awake and alert and patient cooperative Pain management: pain level controlled Vital Signs Assessment: post-procedure vital signs reviewed and stable Respiratory status: spontaneous breathing and respiratory function stable Cardiovascular status: stable Anesthetic complications: no    Last Vitals:  Vitals:   09/14/16 1512 09/14/16 1524  BP: 140/86 (!) 164/81  Pulse: 95 94  Resp: 16 18  Temp: 36.9 C 36.6 C    Last Pain:  Vitals:   09/14/16 1534  TempSrc:   PainSc: 6                  Shemeka Wardle S

## 2016-09-15 ENCOUNTER — Encounter (HOSPITAL_COMMUNITY): Payer: Self-pay | Admitting: Urology

## 2017-10-08 ENCOUNTER — Other Ambulatory Visit: Payer: Self-pay

## 2017-10-08 ENCOUNTER — Emergency Department (HOSPITAL_BASED_OUTPATIENT_CLINIC_OR_DEPARTMENT_OTHER)
Admission: EM | Admit: 2017-10-08 | Discharge: 2017-10-09 | Disposition: A | Discharge status: Home / Self Care | Payer: 59 | Attending: Emergency Medicine | Admitting: Emergency Medicine

## 2017-10-08 ENCOUNTER — Emergency Department (HOSPITAL_BASED_OUTPATIENT_CLINIC_OR_DEPARTMENT_OTHER): Payer: 59

## 2017-10-08 ENCOUNTER — Encounter (HOSPITAL_BASED_OUTPATIENT_CLINIC_OR_DEPARTMENT_OTHER): Payer: Self-pay | Admitting: Emergency Medicine

## 2017-10-08 DIAGNOSIS — Y9239 Other specified sports and athletic area as the place of occurrence of the external cause: Secondary | ICD-10-CM | POA: Insufficient documentation

## 2017-10-08 DIAGNOSIS — S93601A Unspecified sprain of right foot, initial encounter: Secondary | ICD-10-CM | POA: Insufficient documentation

## 2017-10-08 DIAGNOSIS — S99911A Unspecified injury of right ankle, initial encounter: Secondary | ICD-10-CM | POA: Diagnosis present

## 2017-10-08 DIAGNOSIS — S93401A Sprain of unspecified ligament of right ankle, initial encounter: Secondary | ICD-10-CM | POA: Diagnosis not present

## 2017-10-08 DIAGNOSIS — X500XXA Overexertion from strenuous movement or load, initial encounter: Secondary | ICD-10-CM | POA: Insufficient documentation

## 2017-10-08 DIAGNOSIS — Y9354 Activity, bowling: Secondary | ICD-10-CM | POA: Diagnosis not present

## 2017-10-08 DIAGNOSIS — I10 Essential (primary) hypertension: Secondary | ICD-10-CM | POA: Insufficient documentation

## 2017-10-08 DIAGNOSIS — Y999 Unspecified external cause status: Secondary | ICD-10-CM | POA: Diagnosis not present

## 2017-10-08 NOTE — ED Triage Notes (Signed)
Pt presents with c/o right ankle pain from twisting it tonight while he was bowling.

## 2017-10-09 MED ORDER — IBUPROFEN 800 MG PO TABS: 800.0000 mg | ORAL_TABLET | Freq: Once | ORAL | Status: DC

## 2017-10-09 MED ORDER — IBUPROFEN 800 MG PO TABS
800.0000 mg | ORAL_TABLET | Freq: Once | ORAL | Status: AC
  Administered 2017-10-09: 800 mg via ORAL
  Filled 2017-10-09: qty 1

## 2017-10-09 NOTE — Discharge Instructions (Addendum)
Rest - please stay off ankle as much as possible °Ice - ice for 20 minutes at a time, several times a day °Compression - wear brace to provide support °Elevate - elevate ankle above level of heart °Ibuprofen - take with food. Take up to 3-4 times daily ° °

## 2017-10-09 NOTE — ED Notes (Signed)
Pt verbalizes understanding of d/c instructions and denies any further needs at this time. 

## 2017-10-09 NOTE — ED Provider Notes (Signed)
[facility] EMERGENCY DEPARTMENT Provider Note   CSN: [id number] Arrival date & time: 10/08/17  2256     History   Chief Complaint Chief Complaint  Patient presents with  . Ankle Injury    HPI Tristan Perez is a 46 y.o. male who presents with right ankle and foot pain. PMH significant for obesity. He slipped while bowling tonight. He states his ankle inverted and then everted and he stepped on the top of his foot. He was able to ambulate since the accident. He has some mild swelling in the foot and ankle. No numbness or weakness.  HPI  Past Medical History:  Diagnosis Date  . History of kidney stones   . History of urinary tract infection   . Hypertension    Pt states was on beta blocker 10 years ago but took only for 6 months   . Obesity     Patient Active Problem List   Diagnosis Date Noted  . ALLERGIC RHINITIS 06/13/2010  . SNORING 06/13/2010  . HYPERGLYCEMIA 06/03/2010  . HYPERTENSION 05/28/2010  . FATIGUE 05/28/2010  . CHEST WALL PAIN, ANTERIOR 06/20/2009  . TRANSAMINASES, SERUM, ELEVATED 10/15/2008  . MORBID OBESITY 08/17/2008    Past Surgical History:  Procedure Laterality Date  . CYSTOSCOPY/URETEROSCOPY/HOLMIUM LASER/STENT PLACEMENT Left 09/14/2016   Procedure: CYSTOSCOPY/RETROGRADES/URETEROSCOPY/HOLMIUM LASER/STENT PLACEMENT;  Surgeon: Heloise PurpuraLester Borden, MD;  Location: [facility];  Service: Urology;  Laterality: Left;  . extraction of wisdom teeth         Home Medications    Prior to Admission medications   Medication Sig Start Date End Date Taking? Authorizing Provider  acetaminophen (TYLENOL) 500 MG tablet Take 1,000 mg by mouth every 6 (six) hours as needed for mild pain.    [provider]  ciprofloxacin (CIPRO) 500 MG tablet Take 1 tablet (500 mg total) by mouth 2 (two) times daily. 09/09/16   Devoria AlbeKnapp, Iva, MD  ondansetron (ZOFRAN) 4 MG tablet Take 1 tablet (4 mg total) by mouth every 8 (eight) hours as needed for nausea or  vomiting. 09/09/16   Devoria AlbeKnapp, Iva, MD  oxyCODONE-acetaminophen (ROXICET) 5-325 MG tablet Take 1 tablet by mouth every 4 (four) hours as needed for severe pain. 09/14/16   Heloise PurpuraBorden, Lester, MD  PERCOCET 5-325 MG tablet Take 1-2 tablets by mouth every 6 (six) hours as needed for severe pain. 09/09/16   Devoria AlbeKnapp, Iva, MD  tamsulosin (FLOMAX) 0.4 MG CAPS capsule Take 1 po QD until you pass the stone. 09/09/16   Devoria AlbeKnapp, Iva, MD    Family History Family History  Problem Relation Age of Onset  . Diabetes Father   . Hypertension Father   . Stroke Other     Social History Social History   Tobacco Use  . Smoking status: Never Smoker  . Smokeless tobacco: Never Used  Substance Use Topics  . Alcohol use: No  . Drug use: No     Allergies   Patient has no known allergies.   Review of Systems Review of Systems  Musculoskeletal: Positive for arthralgias and joint swelling.  Skin: Negative for wound.  Neurological: Negative for weakness and numbness.     Physical Exam Updated Vital Signs BP (!) 152/96 (BP Location: Left Arm)   Pulse (!) 131   Temp 98.1 F (36.7 C) (Oral)   Resp 18   Ht 6' (1.829 m)   Wt (!) 167.8 kg (370 lb)   SpO2 97%   BMI 50.18 kg/m   Physical Exam  Constitutional: He  is oriented to person, place, and time. He appears well-developed and well-nourished. No distress.  Calm, cooperative. Morbidly obese  HENT:  Head: Normocephalic and atraumatic.  Eyes: Conjunctivae are normal. Pupils are equal, round, and reactive to light. Right eye exhibits no discharge. Left eye exhibits no discharge. No scleral icterus.  Neck: Normal range of motion.  Cardiovascular: Normal rate.  Pulmonary/Chest: Effort normal. No respiratory distress.  Abdominal: He exhibits no distension.  Musculoskeletal:  Right ankle and foot: Mild diffuse pitting edema which appears to be chronic. Tenderness to palpation of dorsal aspect of foot. FROM of ankle and able to wiggle toes. N/V intact.     Neurological: He is alert and oriented to person, place, and time.  Skin: Skin is warm and dry.  Psychiatric: He has a normal mood and affect. His behavior is normal.  Nursing note and vitals reviewed.    ED Treatments / Results  Labs (all labs ordered are listed, but only abnormal results are displayed) Labs Reviewed - No data to display  EKG  EKG Interpretation None       Radiology Dg Ankle Complete Right  Result Date: 10/08/2017 CLINICAL DATA:  Right foot and ankle pain after twisting injury while bowling. EXAM: RIGHT ANKLE - COMPLETE 3+ VIEW COMPARISON:  None. FINDINGS: There is no evidence of fracture, dislocation, or joint effusion. There is no evidence of arthropathy or other focal bone abnormality. Soft tissue edema versus habitus. IMPRESSION: No fracture or subluxation of the right ankle. Electronically Signed   By: Rubye OaksMelanie  Ehinger M.D.   On: 10/08/2017 23:53   Dg Foot Complete Right  Result Date: 10/08/2017 CLINICAL DATA:  Right foot and ankle pain after twisting injury while bowling. EXAM: RIGHT FOOT COMPLETE - 3+ VIEW COMPARISON:  None. FINDINGS: There is no evidence of fracture or dislocation. There is no evidence of arthropathy or other focal bone abnormality. Soft tissue edema versus habitus. IMPRESSION: No fracture or subluxation of the right foot Electronically Signed   By: Rubye OaksMelanie  Ehinger M.D.   On: 10/08/2017 23:53    Procedures Procedures (including critical care time)  Medications Ordered in ED Medications  ibuprofen (ADVIL,MOTRIN) tablet 800 mg (800 mg Oral Given 10/09/17 0037)     Initial Impression / Assessment and Plan / ED Course  I have reviewed the triage vital signs and the nursing notes.  Pertinent labs & imaging results that were available during my care of the patient were reviewed by me and considered in my medical decision making (see chart for details).  46 year old who presents with ankle injury. Pt is noted to be tachycardic. On  review of EMR he is chronically tachycardic. Xrays are negative for bony pathology. Will treat as ankle sprain. Pain medicine given here in ED. ACE wrap given. He has crutches at home. RICE protocol discussed. Advised f/u with PCP.    Final Clinical Impressions(s) / ED Diagnoses   Final diagnoses:  Sprain of right ankle, unspecified ligament, initial encounter  Sprain of right foot, initial encounter    ED Discharge Orders    None       Bethel BornGekas, Moe Graca Marie, PA-C 10/10/17 1752    Bethel BornGekas, Kilani Joffe Marie, PA-C 10/10/17 1753    Charlynne PanderYao, David Hsienta, MD 10/12/17 [phone]

## 2017-10-09 NOTE — ED Notes (Signed)
PMS intact before and after. Pt tolerated well. All questions answered. 

## 2019-06-05 ENCOUNTER — Other Ambulatory Visit: Payer: Self-pay | Admitting: Gastroenterology

## 2019-06-19 ENCOUNTER — Other Ambulatory Visit (HOSPITAL_COMMUNITY)
Admission: RE | Admit: 2019-06-19 | Discharge: 2019-06-19 | Disposition: A | Discharge status: Home / Self Care | Payer: 59 | Source: Ambulatory Visit | Attending: Gastroenterology | Admitting: Gastroenterology

## 2019-06-19 DIAGNOSIS — Z20828 Contact with and (suspected) exposure to other viral communicable diseases: Secondary | ICD-10-CM | POA: Diagnosis not present

## 2019-06-19 DIAGNOSIS — Z01812 Encounter for preprocedural laboratory examination: Secondary | ICD-10-CM | POA: Diagnosis not present

## 2019-06-19 LAB — SARS CORONAVIRUS 2 (TAT 6-24 HRS): SARS Coronavirus 2: NEGATIVE

## 2019-06-21 ENCOUNTER — Encounter (HOSPITAL_COMMUNITY): Payer: Self-pay | Admitting: *Deleted

## 2019-06-21 ENCOUNTER — Other Ambulatory Visit: Payer: Self-pay

## 2019-06-22 ENCOUNTER — Ambulatory Visit (HOSPITAL_COMMUNITY)
Admission: RE | Admit: 2019-06-22 | Discharge: 2019-06-22 | Disposition: A | Discharge status: Home / Self Care | Payer: 59 | Attending: Gastroenterology | Admitting: Gastroenterology

## 2019-06-22 ENCOUNTER — Ambulatory Visit (HOSPITAL_COMMUNITY): Payer: 59 | Admitting: Certified Registered Nurse Anesthetist

## 2019-06-22 ENCOUNTER — Encounter (HOSPITAL_COMMUNITY)
Admission: RE | Disposition: A | Discharge status: Home / Self Care | Payer: Self-pay | Source: Home / Self Care | Attending: Gastroenterology

## 2019-06-22 ENCOUNTER — Ambulatory Visit (HOSPITAL_COMMUNITY): Admit: 2019-06-22 | Payer: 59 | Admitting: Gastroenterology

## 2019-06-22 ENCOUNTER — Encounter (HOSPITAL_COMMUNITY): Payer: Self-pay | Admitting: *Deleted

## 2019-06-22 ENCOUNTER — Encounter (HOSPITAL_COMMUNITY): Payer: Self-pay

## 2019-06-22 ENCOUNTER — Other Ambulatory Visit: Payer: Self-pay

## 2019-06-22 DIAGNOSIS — K625 Hemorrhage of anus and rectum: Secondary | ICD-10-CM | POA: Diagnosis present

## 2019-06-22 DIAGNOSIS — K921 Melena: Secondary | ICD-10-CM | POA: Insufficient documentation

## 2019-06-22 DIAGNOSIS — K640 First degree hemorrhoids: Secondary | ICD-10-CM | POA: Insufficient documentation

## 2019-06-22 DIAGNOSIS — Z6841 Body Mass Index (BMI) 40.0 and over, adult: Secondary | ICD-10-CM | POA: Insufficient documentation

## 2019-06-22 DIAGNOSIS — Q438 Other specified congenital malformations of intestine: Secondary | ICD-10-CM | POA: Diagnosis not present

## 2019-06-22 DIAGNOSIS — I10 Essential (primary) hypertension: Secondary | ICD-10-CM | POA: Insufficient documentation

## 2019-06-22 HISTORY — DX: Constipation, unspecified: K59.00

## 2019-06-22 HISTORY — PX: COLONOSCOPY WITH PROPOFOL: SHX5780

## 2019-06-22 SURGERY — COLONOSCOPY
Anesthesia: Monitor Anesthesia Care

## 2019-06-22 SURGERY — COLONOSCOPY WITH PROPOFOL
Anesthesia: Monitor Anesthesia Care

## 2019-06-22 MED ORDER — LACTATED RINGERS IV SOLN
INTRAVENOUS | Status: DC
Start: 2019-06-22 — End: 2019-06-22
  Administered 2019-06-22: 09:00:00 1000 mL via INTRAVENOUS

## 2019-06-22 MED ORDER — PROPOFOL 10 MG/ML IV BOLUS
INTRAVENOUS | Status: AC
  Filled 2019-06-22: qty 20

## 2019-06-22 MED ORDER — SODIUM CHLORIDE 0.9 % IV SOLN: INTRAVENOUS | Status: DC

## 2019-06-22 MED ORDER — PROPOFOL 10 MG/ML IV BOLUS
INTRAVENOUS | Status: DC | PRN
  Administered 2019-06-22 (×2): 40 mg via INTRAVENOUS

## 2019-06-22 MED ORDER — PROPOFOL 10 MG/ML IV BOLUS
INTRAVENOUS | Status: AC
  Filled 2019-06-22: qty 40

## 2019-06-22 MED ORDER — PROPOFOL 500 MG/50ML IV EMUL
INTRAVENOUS | Status: DC | PRN
  Administered 2019-06-22: 125 ug/kg/min via INTRAVENOUS

## 2019-06-22 SURGICAL SUPPLY — 22 items

## 2019-06-22 NOTE — Transfer of Care (Signed)
Immediate Anesthesia Transfer of Care Note  Patient: Tristan Perez  Procedure(s) Performed: COLONOSCOPY WITH PROPOFOL (N/A )  Patient Location: PACU  Anesthesia Type:MAC  Level of Consciousness: awake, alert , oriented and patient cooperative  Airway & Oxygen Therapy: Patient Spontanous Breathing and Patient connected to face mask  Post-op Assessment: Report given to RN and Post -op Vital signs reviewed and stable  Post vital signs: Reviewed and stable  Last Vitals:  Vitals Value Taken Time  BP 104/40 06/22/19 1009  Temp 36.8 C 06/22/19 1009  Pulse 89 06/22/19 1009  Resp 18 06/22/19 1009  SpO2 100 % 06/22/19 1009    Last Pain:  Vitals:   06/22/19 1009  TempSrc: Temporal  PainSc: 0-No pain         Complications: No apparent anesthesia complications

## 2019-06-22 NOTE — H&P (Signed)
Subjective:   Patient is a 48 y.o. male presents with need for colonoscopy for rectal bleeding.. Procedure including risks and benefits discussed in office.  Patient Active Problem List   Diagnosis Date Noted  . ALLERGIC RHINITIS 06/13/2010  . SNORING 06/13/2010  . HYPERGLYCEMIA 06/03/2010  . HYPERTENSION 05/28/2010  . FATIGUE 05/28/2010  . CHEST WALL PAIN, ANTERIOR 06/20/2009  . TRANSAMINASES, SERUM, ELEVATED 10/15/2008  . MORBID OBESITY 08/17/2008   Past Medical History:  Diagnosis Date  . Constipation    AND ALTERNATES WITH DIARRHEA AND SOME BLOOD SINCE END OF JUNE 2020  . History of kidney stones   . History of urinary tract infection   . Hypertension    Pt states was on beta blocker 10 years ago but took only for 6 months   . Obesity     Past Surgical History:  Procedure Laterality Date  . CYSTOSCOPY/URETEROSCOPY/HOLMIUM LASER/STENT PLACEMENT Left 09/14/2016   Procedure: CYSTOSCOPY/RETROGRADES/URETEROSCOPY/HOLMIUM LASER/STENT PLACEMENT;  Surgeon: Raynelle Bring, MD;  Location: [facility];  Service: Urology;  Laterality: Left;  . extraction of wisdom teeth      Medications Prior to Admission  Medication Sig Dispense Refill Last Dose  . polyethylene glycol (MIRALAX / GLYCOLAX) 17 g packet Take 17 g by mouth every evening.   Past Week at Unknown time  . ibuprofen (ADVIL) 200 MG tablet Take 400 mg by mouth every 6 (six) hours as needed for headache or moderate pain.   More than a month at Unknown time   No Known Allergies  Social History   Tobacco Use  . Smoking status: Never Smoker  . Smokeless tobacco: Never Used  Substance Use Topics  . Alcohol use: No    Family History  Problem Relation Age of Onset  . Diabetes Father   . Hypertension Father   . Stroke Other      Objective:   No data found. No intake/output data recorded. No intake/output data recorded.   See MD Preop evaluation      Assessment:   1.  New onset rectal bleeding.  Plan:   We will  proceed with colonoscopy.  This is been discussed with him in detail in the office.

## 2019-06-22 NOTE — Discharge Instructions (Signed)

## 2019-06-22 NOTE — Op Note (Signed)
[facility] Patient Name: Tristan Perez Procedure Date: 06/22/2019 MRN: [id number] Attending MD: Nancy Fetter Dr., MD Date of Birth: [birth year] CSN: [id number] Age: 48 Admit Type: Outpatient Procedure:                Colonoscopy Indications:              Rectal bleeding Providers:                Jeneen Rinks L. Tanisha Lutes Dr., MD, Grace Isaac, RN, Marguerita Merles, Technician Referring MD:              Medicines:                Monitored Anesthesia Care Complications:            No immediate complications. Estimated Blood Loss:     Estimated blood loss: none. Procedure:                Pre-Anesthesia Assessment:                           - Prior to the procedure, a History and Physical                            was performed, and patient medications and                            allergies were reviewed. The patient's tolerance of                            previous anesthesia was also reviewed. The risks                            and benefits of the procedure and the sedation                            options and risks were discussed with the patient.                            All questions were answered, and informed consent                            was obtained. Prior Anticoagulants: The patient has                            taken no previous anticoagulant or antiplatelet                            agents. ASA Grade Assessment: IV - A patient with                            severe systemic disease that is a constant threat  to life. After reviewing the risks and benefits,                            the patient was deemed in satisfactory condition to                            undergo the procedure.                           After obtaining informed consent, the colonoscope                            was passed under direct vision. Throughout the                            procedure, the patient's blood pressure,  pulse, and                            oxygen saturations were monitored continuously. The                            CF-HQ190L ([id number] Olympus colonoscope was                            introduced through the anus and advanced to the the                            hepatic flexure. The colonoscopy was technically                            difficult and complex due to restricted mobility of                            the colon, a redundant colon, significant looping,                            a tortuous colon and the patient's body habitus.                            Successful completion of the procedure was aided by                            applying abdominal pressure. The patient tolerated                            the procedure well. The quality of the bowel                            preparation was good. The rectum was photographed. Scope In: 9:41:57 AM Scope Out: 10:04:36 AM Total Procedure Duration: 0 hours 22 minutes 39 seconds  Findings:      The perianal and digital rectal examinations were normal.      There is no endoscopic evidence of diverticula, inflammation, mass,  polyps, stricture or tumor in the recto-sigmoid colon, in the sigmoid       colon, in the descending colon, in the splenic flexure and in the       transverse colon.      Internal hemorrhoids were found during retroflexion. The hemorrhoids       were medium-sized and Grade I (internal hemorrhoids that do not       prolapse). Impression:               - Internal hemorrhoids.                           - No specimens collected.                           - Blood found in stool Moderate Sedation:      MAC by anesthesia Recommendation:           - Patient has a contact number available for                            emergencies. The signs and symptoms of potential                            delayed complications were discussed with the                            patient. Return to normal activities  tomorrow.                            Written discharge instructions were provided to the                            patient.                           - Resume previous diet.                           - Continue present medications.                           - Repeat colonoscopy in 10 years for screening                            purposes.                           - Perform an air contrast barium enema at                            appointment to be scheduled. Procedure Code(s):        --- Professional ---                           [id number], 5, Colonoscopy, flexible; diagnostic,  including collection of specimen(s) by brushing or                            washing, when performed (separate procedure) Diagnosis Code(s):        --- Professional ---                           K64.0, First degree hemorrhoids                           K62.5, Hemorrhage of anus and rectum CPT copyright 2019 American Medical Association. All rights reserved. The codes documented in this report are preliminary and upon coder review may  be revised to meet current compliance requirements. Nancy Fetter Dr., MD 06/22/2019 10:10:50 AM This report has been signed electronically. Number of Addenda: 0

## 2019-06-22 NOTE — Anesthesia Procedure Notes (Signed)
Procedure Name: MAC Date/Time: 06/22/2019 9:30 AM Performed by: Claudia Desanctis, CRNA Oxygen Delivery Method: Simple face mask

## 2019-06-22 NOTE — Anesthesia Preprocedure Evaluation (Addendum)
Anesthesia Evaluation  Patient identified by MRN, date of birth, ID band Patient awake    Reviewed: Allergy & Precautions, NPO status , Patient's Chart, lab work & pertinent test results  Airway Mallampati: III  TM Distance: >3 FB Neck ROM: Full    Dental no notable dental hx.    Pulmonary neg pulmonary ROS,    Pulmonary exam normal breath sounds clear to auscultation       Cardiovascular hypertension, Normal cardiovascular exam Rhythm:Regular Rate:Normal     Neuro/Psych negative neurological ROS  negative psych ROS   GI/Hepatic negative GI ROS, Neg liver ROS,   Endo/Other  Morbid obesity (Super)  Renal/GU negative Renal ROS     Musculoskeletal negative musculoskeletal ROS (+)   Abdominal   Peds  Hematology negative hematology ROS (+)   Anesthesia Other Findings Rectal bleeding   Reproductive/Obstetrics                            Anesthesia Physical Anesthesia Plan  ASA: IV  Anesthesia Plan: MAC   Post-op Pain Management:    Induction:   PONV Risk Score and Plan: 1 and Propofol infusion and Treatment may vary due to age or medical condition  Airway Management Planned: Simple Face Mask  Additional Equipment:   Intra-op Plan:   Post-operative Plan:   Informed Consent: I have reviewed the patients History and Physical, chart, labs and discussed the procedure including the risks, benefits and alternatives for the proposed anesthesia with the patient or authorized representative who has indicated his/her understanding and acceptance.     Dental advisory given  Plan Discussed with: CRNA  Anesthesia Plan Comments:        Anesthesia Quick Evaluation

## 2019-06-22 NOTE — Anesthesia Postprocedure Evaluation (Signed)
Anesthesia Post Note  Patient: Tristan Perez  Procedure(s) Performed: COLONOSCOPY WITH PROPOFOL (N/A )     Patient location during evaluation: Endoscopy Anesthesia Type: MAC Level of consciousness: awake and alert Pain management: pain level controlled Vital Signs Assessment: post-procedure vital signs reviewed and stable Respiratory status: spontaneous breathing, nonlabored ventilation, respiratory function stable and patient connected to nasal cannula oxygen Cardiovascular status: stable and blood pressure returned to baseline Postop Assessment: no apparent nausea or vomiting Anesthetic complications: no    Last Vitals:  Vitals:   06/22/19 1030 06/22/19 1041  BP: (!) 106/47 (!) 122/58  Pulse: 80 76  Resp:  18  Temp:    SpO2: 99% 98%    Last Pain:  Vitals:   06/22/19 1041  TempSrc:   PainSc: 0-No pain                 Ryan P Ellender

## 2019-06-26 ENCOUNTER — Other Ambulatory Visit: Payer: Self-pay | Admitting: Gastroenterology

## 2019-06-26 DIAGNOSIS — K625 Hemorrhage of anus and rectum: Secondary | ICD-10-CM

## 2019-07-10 ENCOUNTER — Ambulatory Visit (INDEPENDENT_AMBULATORY_CARE_PROVIDER_SITE_OTHER): Payer: 59 | Admitting: Bariatrics

## 2019-07-10 ENCOUNTER — Encounter (INDEPENDENT_AMBULATORY_CARE_PROVIDER_SITE_OTHER): Payer: Self-pay | Admitting: Bariatrics

## 2019-07-10 ENCOUNTER — Other Ambulatory Visit: Payer: Self-pay

## 2019-07-10 VITALS — BP 138/85 | HR 83 | Temp 98.0°F | Ht 71.0 in | Wt 380.0 lb

## 2019-07-10 DIAGNOSIS — Z0289 Encounter for other administrative examinations: Secondary | ICD-10-CM

## 2019-07-10 DIAGNOSIS — I10 Essential (primary) hypertension: Secondary | ICD-10-CM | POA: Diagnosis not present

## 2019-07-10 DIAGNOSIS — Z1331 Encounter for screening for depression: Secondary | ICD-10-CM

## 2019-07-10 DIAGNOSIS — R0683 Snoring: Secondary | ICD-10-CM

## 2019-07-10 DIAGNOSIS — Z87442 Personal history of urinary calculi: Secondary | ICD-10-CM

## 2019-07-10 DIAGNOSIS — R7402 Elevation of levels of lactic acid dehydrogenase (LDH): Secondary | ICD-10-CM

## 2019-07-10 DIAGNOSIS — R5383 Other fatigue: Secondary | ICD-10-CM | POA: Diagnosis not present

## 2019-07-10 DIAGNOSIS — Z6841 Body Mass Index (BMI) 40.0 and over, adult: Secondary | ICD-10-CM

## 2019-07-10 DIAGNOSIS — R7309 Other abnormal glucose: Secondary | ICD-10-CM

## 2019-07-10 DIAGNOSIS — R740 Nonspecific elevation of levels of transaminase and lactic acid dehydrogenase [LDH]: Secondary | ICD-10-CM | POA: Diagnosis not present

## 2019-07-10 DIAGNOSIS — R0602 Shortness of breath: Secondary | ICD-10-CM

## 2019-07-10 DIAGNOSIS — Z9189 Other specified personal risk factors, not elsewhere classified: Secondary | ICD-10-CM | POA: Diagnosis not present

## 2019-07-11 ENCOUNTER — Ambulatory Visit
Admission: RE | Admit: 2019-07-11 | Discharge: 2019-07-11 | Disposition: A | Discharge status: Home / Self Care | Payer: 59 | Source: Ambulatory Visit | Attending: Gastroenterology | Admitting: Gastroenterology

## 2019-07-11 ENCOUNTER — Encounter (INDEPENDENT_AMBULATORY_CARE_PROVIDER_SITE_OTHER): Payer: Self-pay | Admitting: Bariatrics

## 2019-07-11 DIAGNOSIS — R7303 Prediabetes: Secondary | ICD-10-CM | POA: Insufficient documentation

## 2019-07-11 DIAGNOSIS — K625 Hemorrhage of anus and rectum: Secondary | ICD-10-CM

## 2019-07-11 DIAGNOSIS — E559 Vitamin D deficiency, unspecified: Secondary | ICD-10-CM | POA: Insufficient documentation

## 2019-07-11 LAB — INSULIN, RANDOM: INSULIN: 42.3 u[IU]/mL — ABNORMAL HIGH (ref 2.6–24.9)

## 2019-07-11 LAB — COMPREHENSIVE METABOLIC PANEL
ALT: 22 IU/L (ref 0–44)
AST: 14 IU/L (ref 0–40)
Albumin/Globulin Ratio: 1.9 (ref 1.2–2.2)
Albumin: 4.5 g/dL (ref 4.0–5.0)
Alkaline Phosphatase: 83 IU/L (ref 39–117)
BUN/Creatinine Ratio: 15 (ref 9–20)
BUN: 13 mg/dL (ref 6–24)
Bilirubin Total: 0.5 mg/dL (ref 0.0–1.2)
CO2: 20 mmol/L (ref 20–29)
Calcium: 9.6 mg/dL (ref 8.7–10.2)
Chloride: 104 mmol/L (ref 96–106)
Creatinine, Ser: 0.86 mg/dL (ref 0.76–1.27)
GFR calc Af Amer: 120 mL/min/{1.73_m2} (ref >59)
GFR calc non Af Amer: 104 mL/min/{1.73_m2} (ref >59)
Globulin, Total: 2.4 g/dL (ref 1.5–4.5)
Glucose: 111 mg/dL — ABNORMAL HIGH (ref 65–99)
Potassium: 4.5 mmol/L (ref 3.5–5.2)
Sodium: 140 mmol/L (ref 134–144)
Total Protein: 6.9 g/dL (ref 6.0–8.5)

## 2019-07-11 LAB — HEMOGLOBIN A1C
Est. average glucose Bld gHb Est-mCnc: 120 mg/dL
Hgb A1c MFr Bld: 5.8 % — ABNORMAL HIGH (ref 4.8–5.6)

## 2019-07-11 LAB — VITAMIN D 25 HYDROXY (VIT D DEFICIENCY, FRACTURES): Vit D, 25-Hydroxy: 17.5 ng/mL — ABNORMAL LOW (ref 30.0–100.0)

## 2019-07-11 NOTE — Progress Notes (Signed)
Office: [phone]  /  Fax: [phone]   Dear Dr. Artist Pais,   Thank you for referring ELIS SIMONINI to our clinic. The following note includes my evaluation and treatment recommendations.  HPI:   Chief Complaint: OBESITY    BABACAR BELL has been referred by Doe-Hyun R. Artist Pais, DO for consultation regarding his obesity and obesity related comorbidities.    GEROD BOHRER (MR# [id number]) is a 48 y.o. male who presents on 07/10/2019 for obesity evaluation and treatment. Current BMI is Body mass index is 53 kg/m.Marland Kitchen Seab has been struggling with his weight for many years and has been unsuccessful in either losing weight, maintaining weight loss, or reaching his healthy weight goal.     Hondo attended our information session and states he is currently in the action stage of change and ready to dedicate time achieving and maintaining a healthier weight. Esther is interested in becoming our patient and working on intensive lifestyle modifications including (but not limited to) diet, exercise and weight loss.    Jamarques states his family eats meals together he thinks his family will eat healthier with him his desired weight loss is 120 lbs he started gaining weight after college his heaviest weight ever was 430 lbs. he does like to cook he denies cravings he dislikes spicy food he struggles with emotional eating  he has low metabolism for age and sex    Fatigue Fishel feels his energy is lower than it should be. This has worsened with weight gain and has not worsened recently. Yonic admits to daytime somnolence and he admits to waking up still tired. Patient is at risk for obstructive sleep apnea. Patent has a history of symptoms of daytime fatigue, morning fatigue and hypertension. Patient generally gets 5 or 6 hours of sleep per night, and states they generally have  restful sleep. Snoring is present. Apneic episodes are not present. Epworth Sleepiness Score is 5  Dyspnea  on exertion Fadil notes increasing shortness of breath with certain activities and seems to be worsening over time with weight gain. He notes getting out of breath sooner with activity than he used to. This has not gotten worse recently. Dominiq denies orthopnea.  Hypertension TASHAUN LISTER is a 49 y.o. male with hypertension. He is not on medications. RISHAB BIAGIONI denies chest pain or shortness of breath on exertion. He is working weight loss to help control his blood pressure with the goal of decreasing his risk of heart attack and stroke. Douglass blood pressure is fairly well controlled.  Elevated Transaminases Atlas had ultrasound that was consistent with fatty liver 10/22/2008.  Hyperglycemia Nix has a history of some elevated blood glucose readings without a diagnosis of diabetes. He has a normal appetite and denies polyphagia.  At risk for diabetes Vatsal is at higher than average risk for developing diabetes due to his obesity and hyperglycemia. He currently denies polyuria or polydipsia.  Snoring Janelle reports snoring and he is not on CPAP. He had testing for sleep apnea approximately ten years ago.  History of Nephrolithiasis Zymiere has a history of one episode of nephrolithiasis. He denies abdominal pain.  Depression Screen Beauford's Food and Mood (modified PHQ-9) score was  Depression screen PHQ 2/9 07/10/2019  Decreased Interest 1  Down, Depressed, Hopeless 1  PHQ - 2 Score 2  Altered sleeping 1  Tired, decreased energy 2  Change in appetite 0  Feeling bad or failure about yourself  0  Trouble concentrating 0  Moving  slowly or fidgety/restless 0  Suicidal thoughts 0  PHQ-9 Score 5  Difficult doing work/chores Not difficult at all    ASSESSMENT AND PLAN:  Other fatigue - Plan: EKG 12-Lead, Comprehensive metabolic panel, VITAMIN D 25 Hydroxy (Vit-D Deficiency, Fractures)  SOB (shortness of breath) on exertion  Essential  hypertension  TRANSAMINASES, SERUM, ELEVATED  HYPERGLYCEMIA - Plan: Hemoglobin A1c, Insulin, random  Snoring  Depression screening  History of nephrolithiasis  At risk for diabetes mellitus  Class 3 severe obesity with serious comorbidity and body mass index (BMI) of 50.0 to 59.9 in adult, unspecified obesity type (HCC)  PLAN:  Fatigue Riley LamDouglas was informed that his fatigue may be related to obesity, depression or many other causes. Labs will be ordered, and in the meanwhile Riley LamDouglas has agreed to work on diet, exercise and weight loss to help with fatigue. Proper sleep hygiene was discussed including the need for 7-8 hours of quality sleep each night. A sleep study was not ordered based on symptoms and Epworth score.  Dyspnea on exertion Terique's shortness of breath appears to be obesity related and exercise induced. He has agreed to work on weight loss and gradually increase exercise to treat his exercise induced shortness of breath. If Riley LamDouglas follows our instructions and loses weight without improvement of his shortness of breath, we will plan to refer to pulmonology. We will monitor this condition regularly. Riley LamDouglas agrees to this plan.  Hypertension We discussed sodium restriction, cooking out less often, working on healthy weight loss, and a regular exercise program as the means to achieve improved blood pressure control. Riley LamDouglas agreed with this plan and agreed to follow up as directed. We will continue to monitor his blood pressure as well as his progress with the above lifestyle modifications. He will watch for signs of hypotension as he continues his lifestyle modifications.  Elevated Transaminases Riley LamDouglas will increase exercise (150 minutes per week) and he will work on weight loss.  Hyperglycemia Fasting labs will be obtained (Hgb A1c, insulin) and results with be discussed with Riley Lamouglas in 2 weeks at his follow up visit. In the meanwhile Riley LamDouglas was started on a lower  simple carbohydrate diet and will work on weight loss efforts.  Diabetes risk counseling Riley LamDouglas was given extended (15 minutes) diabetes prevention counseling today. He is 48 y.o. male and has risk factors for diabetes including obesity and hyperglycemia. We discussed intensive lifestyle modifications today with an emphasis on weight loss as well as increasing exercise and decreasing simple carbohydrates in his diet.  Snoring We discussed sleep apnea. No sleep testing at this time.  History of Nephrolithiasis We discussed contraindications to certain medications with a history of renal calculi. Riley LamDouglas will continue to drink adequate water and he will follow up as directed.  Depression Screen Riley LamDouglas had a mildly positive depression screening. Depression is commonly associated with obesity and often results in emotional eating behaviors. We will monitor this closely and work on CBT to help improve the non-hunger eating patterns. Referral to Psychology may be required if no improvement is seen as he continues in our clinic.  Obesity Riley LamDouglas is currently in the action stage of change and his goal is to continue with weight loss efforts. I recommend Riley LamDouglas begin the structured treatment plan as follows:  He has agreed to follow the Category 2 plan  Riley LamDouglas has been instructed to eventually work up to a goal of 150 minutes of combined cardio and strengthening exercise per week for weight loss and overall health  benefits. We discussed the following Behavioral Modification Strategies today: planning for success, increase H2O intake, no skipping meals, keeping healthy foods in the home, increasing lean protein intake, decreasing simple carbohydrates, increasing vegetables, decrease eating out and work on meal planning and intentional eating   He was informed of the importance of frequent follow up visits to maximize his success with intensive lifestyle modifications for his multiple health  conditions. He was informed we would discuss his lab results at his next visit unless there is a critical issue that needs to be addressed sooner. Grabiel agreed to keep his next visit at the agreed upon time to discuss these results.  ALLERGIES: No Known Allergies  MEDICATIONS: Current Outpatient Medications on File Prior to Visit  Medication Sig Dispense Refill   ibuprofen (ADVIL) 200 MG tablet Take 400 mg by mouth every 6 (six) hours as needed for headache or moderate pain.     polyethylene glycol (MIRALAX / GLYCOLAX) 17 g packet Take 17 g by mouth every evening.     No current facility-administered medications on file prior to visit.     PAST MEDICAL HISTORY: Past Medical History:  Diagnosis Date   ADHD    Constipation    AND ALTERNATES WITH DIARRHEA AND SOME BLOOD SINCE END OF JUNE 2020   Eczema    Fatty liver    High blood pressure    History of kidney stones    History of urinary tract infection    Hypertension    Pt states was on beta blocker 10 years ago but took only for 6 months    Kidney stones    Obesity    Palpitations    Prediabetes     PAST SURGICAL HISTORY: Past Surgical History:  Procedure Laterality Date   COLONOSCOPY WITH PROPOFOL N/A 06/22/2019   Procedure: COLONOSCOPY WITH PROPOFOL;  Surgeon: Carman Ching, MD;  Location: [facility];  Service: Endoscopy;  Laterality: N/A;   CYSTOSCOPY/URETEROSCOPY/HOLMIUM LASER/STENT PLACEMENT Left 09/14/2016   Procedure: CYSTOSCOPY/RETROGRADES/URETEROSCOPY/HOLMIUM LASER/STENT PLACEMENT;  Surgeon: Heloise Purpura, MD;  Location: [facility];  Service: Urology;  Laterality: Left;   extraction of wisdom teeth      SOCIAL HISTORY: Social History   Tobacco Use   Smoking status: Never Smoker   Smokeless tobacco: Never Used  Substance Use Topics   Alcohol use: No   Drug use: No    FAMILY HISTORY: Family History  Problem Relation Age of Onset   Diabetes Father    Hypertension Father     Obesity Father    Stroke Other    Hyperlipidemia Mother     ROS: Review of Systems  Constitutional: Positive for malaise/fatigue.  Eyes:       Positive for Wear Glasses or Contacts  Respiratory: Positive for shortness of breath (on exertion).   Cardiovascular: Negative for chest pain and orthopnea.  Gastrointestinal: Positive for constipation. Negative for abdominal pain.       Positive for rectal bleeding  Genitourinary: Negative for frequency.  Skin: Positive for itching and rash.       Positive for Dryness  Endo/Heme/Allergies: Negative for polydipsia.       Negative for polyphagia  Psychiatric/Behavioral: The patient has insomnia.     PHYSICAL EXAM: Blood pressure 138/85, pulse 83, temperature 98 F (36.7 C), temperature source Oral, height 5\' 11"  (1.803 m), weight (!) 380 lb (172.4 kg), SpO2 96 %. Body mass index is 53 kg/m. Physical Exam Vitals signs reviewed.  Constitutional:      Appearance:  He is well-developed.  HENT:     Head: Normocephalic and atraumatic.     Nose: Nose normal.  Eyes:     General: No scleral icterus.    Extraocular Movements: Extraocular movements intact.  Neck:     Musculoskeletal: Normal range of motion and neck supple.     Thyroid: No thyromegaly.  Cardiovascular:     Rate and Rhythm: Normal rate and regular rhythm.  Pulmonary:     Effort: Pulmonary effort is normal. No respiratory distress.  Abdominal:     Palpations: Abdomen is soft.     Tenderness: There is no abdominal tenderness.  Musculoskeletal: Normal range of motion.     Comments: Range of Motion normal in all 4 extremities  Skin:    General: Skin is warm and dry.  Neurological:     Mental Status: He is alert and oriented to person, place, and time.  Psychiatric:        Behavior: Behavior normal.     RECENT LABS AND TESTS: BMET    Component Value Date/Time   NA WILL FOLLOW 07/10/2019 0840   K WILL FOLLOW 07/10/2019 0840   CL WILL FOLLOW 07/10/2019 0840   CO2  WILL FOLLOW 07/10/2019 0840   GLUCOSE WILL FOLLOW 07/10/2019 0840   GLUCOSE 110 (H) 09/14/2016 1210   BUN WILL FOLLOW 07/10/2019 0840   CREATININE WILL FOLLOW 07/10/2019 0840   CALCIUM WILL FOLLOW 07/10/2019 0840   GFRNONAA WILL FOLLOW 07/10/2019 0840   GFRAA WILL FOLLOW 07/10/2019 0840   Lab Results  Component Value Date   HGBA1C WILL FOLLOW 07/10/2019   Lab Results  Component Value Date   INSULIN WILL FOLLOW 07/10/2019   CBC    Component Value Date/Time   WBC 9.2 09/14/2016 1210   RBC 5.09 09/14/2016 1210   HGB 14.1 09/14/2016 1210   HCT 43.3 09/14/2016 1210   PLT 357 09/14/2016 1210   MCV 85.1 09/14/2016 1210   MCH 27.7 09/14/2016 1210   MCHC 32.6 09/14/2016 1210   RDW 14.3 09/14/2016 1210   LYMPHSABS 2.2 09/09/2016 0009   MONOABS 0.7 09/09/2016 0009   EOSABS 0.0 09/09/2016 0009   BASOSABS 0.0 09/09/2016 0009   Iron/TIBC/Ferritin/ %Sat    Component Value Date/Time   IRON 49 10/15/2008 0845   FERRITIN 52.6 10/15/2008 0845   IRONPCTSAT 10.7 (L) 10/15/2008 0845   Lipid Panel     Component Value Date/Time   CHOL 171 10/02/2008 1152   TRIG 82 10/02/2008 1152   HDL 26.3 (L) 10/02/2008 1152   CHOLHDL 6.5 CALC 10/02/2008 1152   VLDL 16 10/02/2008 1152   LDLCALC 128 (H) 10/02/2008 1152   Hepatic Function Panel     Component Value Date/Time   PROT WILL FOLLOW 07/10/2019 0840   ALBUMIN WILL FOLLOW 07/10/2019 0840   AST WILL FOLLOW 07/10/2019 0840   ALT WILL FOLLOW 07/10/2019 0840   ALKPHOS WILL FOLLOW 07/10/2019 0840   BILITOT WILL FOLLOW 07/10/2019 0840   BILIDIR 0.1 05/28/2010 2008   IBILI 0.3 05/28/2010 2008      Component Value Date/Time   TSH 2.535 05/28/2010 2008   TSH 0.83 10/02/2008 1152    ECG  shows NSR with a rate of 87 BPM INDIRECT CALORIMETER done today shows a VO2 of 191 and a REE of 1326.  His calculated basal metabolic rate is [id number] thus his basal metabolic rate is worse than expected.      OBESITY BEHAVIORAL INTERVENTION  VISIT  Today's visit was # 1  Starting weight: 380 lbs Starting date: 07/10/2019 Today's weight : 380 lbs Today's date: 07/10/2019 Total lbs lost to date: 0    07/10/2019  Height 5\' 11"  (1.803 m)  Weight 380 lb (172.4 kg) (A)  BMI (Calculated) 53.02  BLOOD PRESSURE - SYSTOLIC 592  BLOOD PRESSURE - DIASTOLIC 85   Body Fat % 92.4 %  Total Body Water (lbs) 149.8 lbs  RMR 1326    ASK: We discussed the diagnosis of obesity with Marny Lowenstein today and Andri agreed to give [place] permission to discuss obesity behavioral modification therapy today.  ASSESS: Eder has the diagnosis of obesity and his BMI today is 53.02 Calen is in the action stage of change   ADVISE: Eudell was educated on the multiple health risks of obesity as well as the benefit of weight loss to improve his health. He was advised of the need for long term treatment and the importance of lifestyle modifications to improve his current health and to decrease his risk of future health problems.  AGREE: Multiple dietary modification options and treatment options were discussed and  Matther agreed to follow the recommendations documented in the above note.  ARRANGE: Lindbergh was educated on the importance of frequent visits to treat obesity as outlined per CMS and USPSTF guidelines and agreed to schedule his next follow up appointment today.  Corey Skains, am acting as Location manager for [employer]. Owens Shark, DO  I have reviewed the above documentation for accuracy and completeness, and I agree with the above. -Jearld Lesch, DO

## 2019-07-12 ENCOUNTER — Encounter (INDEPENDENT_AMBULATORY_CARE_PROVIDER_SITE_OTHER): Payer: Self-pay | Admitting: Bariatrics

## 2019-07-24 ENCOUNTER — Ambulatory Visit (INDEPENDENT_AMBULATORY_CARE_PROVIDER_SITE_OTHER): Payer: 59 | Admitting: Bariatrics

## 2019-07-24 ENCOUNTER — Other Ambulatory Visit: Payer: Self-pay

## 2019-07-24 ENCOUNTER — Encounter (INDEPENDENT_AMBULATORY_CARE_PROVIDER_SITE_OTHER): Payer: Self-pay | Admitting: Bariatrics

## 2019-07-24 VITALS — BP 115/80 | HR 85 | Temp 98.0°F | Ht 71.0 in | Wt 385.0 lb

## 2019-07-24 DIAGNOSIS — R7303 Prediabetes: Secondary | ICD-10-CM | POA: Diagnosis not present

## 2019-07-24 DIAGNOSIS — E559 Vitamin D deficiency, unspecified: Secondary | ICD-10-CM | POA: Diagnosis not present

## 2019-07-24 DIAGNOSIS — Z6841 Body Mass Index (BMI) 40.0 and over, adult: Secondary | ICD-10-CM

## 2019-07-24 DIAGNOSIS — Z9189 Other specified personal risk factors, not elsewhere classified: Secondary | ICD-10-CM | POA: Diagnosis not present

## 2019-07-24 MED ORDER — VITAMIN D (ERGOCALCIFEROL) 1.25 MG (50000 UNIT) PO CAPS: 50000.0000 [IU] | ORAL_CAPSULE | ORAL | 0 refills | Status: DC

## 2019-07-25 ENCOUNTER — Encounter (INDEPENDENT_AMBULATORY_CARE_PROVIDER_SITE_OTHER): Payer: Self-pay | Admitting: Bariatrics

## 2019-07-25 NOTE — Progress Notes (Signed)
Office: [phone]  /  Fax: [phone]   HPI:   Chief Complaint: OBESITY Tristan Perez is here to discuss his progress with his obesity treatment plan. He is on the Category 2 plan and is following his eating plan approximately 95% of the time. He states he is walking 30 minutes 5 times per week and using resistance bands 30 minutes 2 times per week. Tristan Perez is up 5 lbs. He had a barium enema right after his visit and states he followed the diet closely. He struggled with getting started. He reports getting adequate protein. He states he had 2 birthday celebrations over a 2 week period.  His weight is (!) 385 lb (174.6 kg) today and has had a weight gain of 5 lbs since his last visit. He has lost 0 lbs since starting treatment with [place].  Vitamin D deficiency Tristan Perez has a diagnosis of Vitamin D deficiency. Last Vitamin D 17.5 on 07/10/2019. He is currently taking prescription Vit D and denies nausea, vomiting or muscle weakness.  At risk for osteopenia and osteoporosis Tristan Perez is at higher risk of osteopenia and osteoporosis due to Vitamin D deficiency.   Pre-Diabetes (New) Tristan Perez has a new diagnosis of prediabetes based on his elevated Hgb A1c and was informed this puts him at greater risk of developing diabetes. Last A1c 5.8 on 07/10/2019 with an insulin of 42.3. He states he has a strong family history. He denies nausea or hypoglycemia.  ASSESSMENT AND PLAN:  Vitamin D deficiency - Plan: Vitamin D, Ergocalciferol, (DRISDOL) 1.25 MG (50000 UT) CAPS capsule  Prediabetes  At risk for osteoporosis  Class 3 severe obesity with serious comorbidity and body mass index (BMI) of 50.0 to 59.9 in adult, unspecified obesity type (HCC)  PLAN:  Vitamin D Deficiency Tristan Perez was informed that low Vitamin D levels contributes to fatigue and are associated with obesity, breast, and colon cancer. He agrees to continue to take prescription Vit D @ 50,000 IU every week #4 with 0 refills and will  follow-up for routine testing of Vitamin D, at least 2-3 times per year. He was informed of the risk of over-replacement of Vitamin D and agrees to not increase his dose unless he discusses this with [place] first. Tristan Perez agrees to follow-up with our clinic in 2 weeks.  At risk for osteopenia and osteoporosis Tristan Perez was given extended  (15 minutes) osteoporosis prevention counseling today. Tristan Perez is at risk for osteopenia and osteoporosis due to his Vitamin D deficiency. He was encouraged to take his Vitamin D and follow his higher calcium diet and increase strengthening exercise to help strengthen his bones and decrease his risk of osteopenia and osteoporosis.  Pre-Diabetes (New) Tristan Perez will continue to work on weight loss, exercise, and decreasing simple carbohydrates in his diet to help decrease the risk of diabetes. We dicussed metformin including benefits and risks. He was informed that eating too many simple carbohydrates or too many calories at one sitting increases the likelihood of GI side effects. Tristan Perez was instructed to decrease carbohydrates, increase protein, and increase activity. Handout was given on Prediabetes. He will follow-up with [place] as directed to monitor her progress.  Obesity Tristan Perez is currently in the action stage of change. As such, his goal is to continue with weight loss efforts. He has agreed to follow the Category 2 plan. Tristan Perez will work on meal planning, intentional eating, 100 calorie snacks, and continuing to increase his water intake. Tristan Perez has been instructed to continue exercise and will walk at his  break for weight loss and overall health benefits. We discussed the following Behavioral Modification Strategies today: increasing lean protein intake, decreasing simple carbohydrates, increasing vegetables, increase H20 intake, decrease eating out, no skipping meals, work on meal planning and easy cooking plans, and keeping healthy foods in the home.  Tristan Perez has  agreed to follow-up with our clinic in 2 weeks. He was informed of the importance of frequent follow-up visits to maximize his success with intensive lifestyle modifications for his multiple health conditions.  ALLERGIES: No Known Allergies  MEDICATIONS: Current Outpatient Medications on File Prior to Visit  Medication Sig Dispense Refill  . ibuprofen (ADVIL) 200 MG tablet Take 400 mg by mouth every 6 (six) hours as needed for headache or moderate pain.    . polyethylene glycol (MIRALAX / GLYCOLAX) 17 g packet Take 17 g by mouth every evening.     No current facility-administered medications on file prior to visit.     PAST MEDICAL HISTORY: Past Medical History:  Diagnosis Date  . ADHD   . Constipation    AND ALTERNATES WITH DIARRHEA AND SOME BLOOD SINCE END OF JUNE 2020  . Eczema   . Fatty liver   . High blood pressure   . History of kidney stones   . History of urinary tract infection   . Hypertension    Pt states was on beta blocker 10 years ago but took only for 6 months   . Kidney stones   . Obesity   . Palpitations   . Prediabetes     PAST SURGICAL HISTORY: Past Surgical History:  Procedure Laterality Date  . COLONOSCOPY WITH PROPOFOL N/A 06/22/2019   Procedure: COLONOSCOPY WITH PROPOFOL;  Surgeon: Carman Ching, MD;  Location: [facility];  Service: Endoscopy;  Laterality: N/A;  . CYSTOSCOPY/URETEROSCOPY/HOLMIUM LASER/STENT PLACEMENT Left 09/14/2016   Procedure: CYSTOSCOPY/RETROGRADES/URETEROSCOPY/HOLMIUM LASER/STENT PLACEMENT;  Surgeon: Heloise Purpura, MD;  Location: [facility];  Service: Urology;  Laterality: Left;  . extraction of wisdom teeth      SOCIAL HISTORY: Social History   Tobacco Use  . Smoking status: Never Smoker  . Smokeless tobacco: Never Used  Substance Use Topics  . Alcohol use: No  . Drug use: No    FAMILY HISTORY: Family History  Problem Relation Age of Onset  . Diabetes Father   . Hypertension Father   . Obesity Father   . Stroke  Other   . Hyperlipidemia Mother    ROS: Review of Systems  Gastrointestinal: Negative for nausea and vomiting.  Musculoskeletal:       Negative for muscle weakness.  Endo/Heme/Allergies:       Negative for hypoglycemia.   PHYSICAL EXAM: Blood pressure 115/80, pulse 85, temperature 98 F (36.7 C), temperature source Oral, height  (1.803 m), weight (!) 385 lb (174.6 kg), SpO2 98 %. Body mass index is 53.7 kg/m. Physical Exam Vitals signs reviewed.  Constitutional:      Appearance: Normal appearance. He is obese.  Cardiovascular:     Rate and Rhythm: Normal rate.     Pulses: Normal pulses.  Pulmonary:     Effort: Pulmonary effort is normal.     Breath sounds: Normal breath sounds.  Musculoskeletal: Normal range of motion.  Skin:    General: Skin is warm and dry.  Neurological:     Mental Status: He is alert and oriented to person, place, and time.  Psychiatric:        Behavior: Behavior normal.   RECENT LABS AND TESTS: BMET  Component Value Date/Time   NA 140 07/10/2019 0840   K 4.5 07/10/2019 0840   CL 104 07/10/2019 0840   CO2 20 07/10/2019 0840   GLUCOSE 111 (H) 07/10/2019 0840   GLUCOSE 110 (H) 09/14/2016 1210   BUN 13 07/10/2019 0840   CREATININE 0.86 07/10/2019 0840   CALCIUM 9.6 07/10/2019 0840   GFRNONAA 104 07/10/2019 0840   GFRAA 120 07/10/2019 0840   Lab Results  Component Value Date   HGBA1C 5.8 (H) 07/10/2019   HGBA1C 6.0 (H) 06/11/2010   HGBA1C 5.9 10/02/2008   Lab Results  Component Value Date   INSULIN 42.3 (H) 07/10/2019   CBC    Component Value Date/Time   WBC 9.2 09/14/2016 1210   RBC 5.09 09/14/2016 1210   HGB 14.1 09/14/2016 1210   HCT 43.3 09/14/2016 1210   PLT 357 09/14/2016 1210   MCV 85.1 09/14/2016 1210   MCH 27.7 09/14/2016 1210   MCHC 32.6 09/14/2016 1210   RDW 14.3 09/14/2016 1210   LYMPHSABS 2.2 09/09/2016 0009   MONOABS 0.7 09/09/2016 0009   EOSABS 0.0 09/09/2016 0009   BASOSABS 0.0 09/09/2016 0009    Iron/TIBC/Ferritin/ %Sat    Component Value Date/Time   IRON 49 10/15/2008 0845   FERRITIN 52.6 10/15/2008 0845   IRONPCTSAT 10.7 (L) 10/15/2008 0845   Lipid Panel     Component Value Date/Time   CHOL 171 10/02/2008 1152   TRIG 82 10/02/2008 1152   HDL 26.3 (L) 10/02/2008 1152   CHOLHDL 6.5 CALC 10/02/2008 1152   VLDL 16 10/02/2008 1152   LDLCALC 128 (H) 10/02/2008 1152   Hepatic Function Panel     Component Value Date/Time   PROT 6.9 07/10/2019 0840   ALBUMIN 4.5 07/10/2019 0840   AST 14 07/10/2019 0840   ALT 22 07/10/2019 0840   ALKPHOS 83 07/10/2019 0840   BILITOT 0.5 07/10/2019 0840   BILIDIR 0.1 05/28/2010 2008   IBILI 0.3 05/28/2010 2008      Component Value Date/Time   TSH 2.535 05/28/2010 2008   TSH 0.83 10/02/2008 1152   Results for WEST, BOOMERSHINE (MRN [id number]) as of 07/25/2019 07:40  Ref. Range 07/10/2019 08:40  Vitamin D, 25-Hydroxy Latest Ref Range: 30.0 - 100.0 ng/mL 17.5 (L)   OBESITY BEHAVIORAL INTERVENTION VISIT  Today's visit was #2  Starting weight: 380 lbs Starting date: 07/10/2019 Today's weight: 385 lbs  Today's date: 07/24/2019 Total lbs lost to date: 0    07/24/2019  Height 5\' 11"  (1.803 m)  Weight 385 lb (174.6 kg) (A)  BMI (Calculated) 53.72  BLOOD PRESSURE - SYSTOLIC 829  BLOOD PRESSURE - DIASTOLIC 80   Body Fat % 93.7 %  Total Body Water (lbs) 154.8 lbs   ASK: We discussed the diagnosis of obesity with Tristan Perez today and Tristan Perez agreed to give [place] permission to discuss obesity behavioral modification therapy today.  ASSESS: Tristan Perez has the diagnosis of obesity and his BMI today is 53.7. Tristan Perez is in the action stage of change.  ADVISE: Tristan Perez was educated on the multiple health risks of obesity as well as the benefit of weight loss to improve his health. He was advised of the need for long term treatment and the importance of lifestyle modifications to improve his current health and to decrease his risk of future  health problems.  AGREE: Multiple dietary modification options and treatment options were discussed and  Tristan Perez agreed to follow the recommendations documented in the above note.  ARRANGE: Tristan Perez  was educated on the importance of frequent visits to treat obesity as outlined per CMS and USPSTF guidelines and agreed to schedule his next follow up appointment today.  Tristan Perez, Tristan Perez, am acting as Energy managertranscriptionist for [employer] Janeil Schexnayder, DO  I have reviewed the above documentation for accuracy and completeness, and I agree with the above. -Corinna CapraAngel Zyrion Coey, DO

## 2019-08-08 ENCOUNTER — Ambulatory Visit (INDEPENDENT_AMBULATORY_CARE_PROVIDER_SITE_OTHER): Payer: 59 | Admitting: Bariatrics

## 2019-08-10 ENCOUNTER — Ambulatory Visit (INDEPENDENT_AMBULATORY_CARE_PROVIDER_SITE_OTHER): Payer: 59 | Admitting: Bariatrics

## 2019-08-10 ENCOUNTER — Other Ambulatory Visit: Payer: Self-pay

## 2019-08-10 ENCOUNTER — Encounter (INDEPENDENT_AMBULATORY_CARE_PROVIDER_SITE_OTHER): Payer: Self-pay | Admitting: Bariatrics

## 2019-08-10 VITALS — BP 142/84 | HR 86 | Temp 98.2°F | Ht 71.0 in | Wt 388.0 lb

## 2019-08-10 DIAGNOSIS — R7303 Prediabetes: Secondary | ICD-10-CM | POA: Diagnosis not present

## 2019-08-10 DIAGNOSIS — I10 Essential (primary) hypertension: Secondary | ICD-10-CM

## 2019-08-10 DIAGNOSIS — Z6841 Body Mass Index (BMI) 40.0 and over, adult: Secondary | ICD-10-CM | POA: Diagnosis not present

## 2019-08-14 ENCOUNTER — Encounter (INDEPENDENT_AMBULATORY_CARE_PROVIDER_SITE_OTHER): Payer: Self-pay | Admitting: Bariatrics

## 2019-08-14 NOTE — Progress Notes (Signed)
Office: [phone]  /  Fax: [phone]   HPI:   Chief Complaint: OBESITY Tristan Perez is here to discuss his progress with his obesity treatment plan. He is on the Category 2 plan and is following his eating plan approximately 95-100% of the time. He states he is walking 20 minutes 5 times per week. Tristan Perez is up 3 lbs but states he has followed the diet 95-100% of the time. His weight is (!) 388 lb (176 kg) today and has had a weight gain of 3 lbs since his last visit. He has lost 0 lbs since starting treatment with us.  Hypertension Tristan Perez is a 48 y.o. male with hypertension and is on no medication.  Tristan EarthlyDouglas M Olivier denies chest pain or shortness of breath on exertion. He is working weight loss to help control his blood pressure with the goal of decreasing his risk of heart attack and stroke. Tristan Perez' blood pressure is well controlled.   Pre-Diabetes Tristan Perez has a diagnosis of prediabetes based on his elevated Hgb A1c and was informed this puts him at greater risk of developing diabetes. Last A1c 5.8 on 07/10/2019 with an insulin of 42.3. He is not taking metformin currently and continues to work on diet and exercise to decrease risk of diabetes. He denies nausea or hypoglycemia.  ASSESSMENT AND PLAN:  Essential hypertension  Prediabetes  Class 3 severe obesity with serious comorbidity and body mass index (BMI) of 50.0 to 59.9 in adult, unspecified obesity type (HCC)  PLAN:  Hypertension We discussed sodium restriction, working on healthy weight loss, and a regular exercise program as the means to achieve improved blood pressure control. Tristan Perez agreed with this plan and agreed to follow up as directed. We will continue to monitor his blood pressure as well as his progress with the above lifestyle modifications. Tristan Perez was advised to decrease his salt intake and increase walking. He will watch for signs of hypotension as he continues his lifestyle modifications.   Pre-Diabetes Tristan Perez will continue to work on weight loss, exercise, and decreasing simple carbohydrates in his diet to help decrease the risk of diabetes. We dicussed metformin including benefits and risks. He was informed that eating too many simple carbohydrates or too many calories at one sitting increases the likelihood of GI side effects. Tristan Perez was instructed to decrease carbohydrates, increase protein, and increase healthy fats. He will follow-up with us as directed to monitor his progress.  I spent > than 50% of the 15 minute visit on counseling as documented in the note.  Obesity Tristan Perez is currently in the action stage of change. As such, his goal is to continue with weight loss efforts. He has agreed to follow the Category 2 plan. Tristan Perez will work on meal planning, intentional eating, increasing his water intake, and decreasing his portion size. Tristan Perez has been instructed to continue to exercise at work for weight loss and overall health benefits. We discussed the following Behavioral Modification Strategies today: increasing lean protein intake, decreasing simple carbohydrates, increasing vegetables, increase H20 intake, decrease eating out, no skipping meals, work on meal planning and easy cooking plans, keeping healthy foods in the home, and planning for success.  Tristan Perez has agreed to follow-up with our clinic in 2 weeks. He was informed of the importance of frequent follow-up visits to maximize his success with intensive lifestyle modifications for his multiple health conditions.  ALLERGIES: No Known Allergies  MEDICATIONS: Current Outpatient Medications on File Prior to Visit  Medication Sig Dispense Refill  .  ibuprofen (ADVIL) 200 MG tablet Take 400 mg by mouth every 6 (six) hours as needed for headache or moderate pain.    . polyethylene glycol (MIRALAX / GLYCOLAX) 17 g packet Take 17 g by mouth every evening.    . Vitamin D, Ergocalciferol, (DRISDOL) 1.25 MG (50000 UT)  CAPS capsule Take 1 capsule (50,000 Units total) by mouth every 7 (seven) days. 4 capsule 0   No current facility-administered medications on file prior to visit.     PAST MEDICAL HISTORY: Past Medical History:  Diagnosis Date  . ADHD   . Constipation    AND ALTERNATES WITH DIARRHEA AND SOME BLOOD SINCE END OF JUNE 2020  . Eczema   . Fatty liver   . High blood pressure   . History of kidney stones   . History of urinary tract infection   . Hypertension    Pt states was on beta blocker 10 years ago but took only for 6 months   . Kidney stones   . Obesity   . Palpitations   . Prediabetes     PAST SURGICAL HISTORY: Past Surgical History:  Procedure Laterality Date  . COLONOSCOPY WITH PROPOFOL N/A 06/22/2019   Procedure: COLONOSCOPY WITH PROPOFOL;  Surgeon: Laurence Spates, MD;  Location: [facility];  Service: Endoscopy;  Laterality: N/A;  . CYSTOSCOPY/URETEROSCOPY/HOLMIUM LASER/STENT PLACEMENT Left 09/14/2016   Procedure: CYSTOSCOPY/RETROGRADES/URETEROSCOPY/HOLMIUM LASER/STENT PLACEMENT;  Surgeon: Raynelle Bring, MD;  Location: [facility];  Service: Urology;  Laterality: Left;  . extraction of wisdom teeth      SOCIAL HISTORY: Social History   Tobacco Use  . Smoking status: Never Smoker  . Smokeless tobacco: Never Used  Substance Use Topics  . Alcohol use: No  . Drug use: No    FAMILY HISTORY: Family History  Problem Relation Age of Onset  . Diabetes Father   . Hypertension Father   . Obesity Father   . Stroke Other   . Hyperlipidemia Mother    ROS: Review of Systems  Respiratory: Negative for shortness of breath.   Cardiovascular: Negative for chest pain.  Gastrointestinal: Negative for nausea.  Endo/Heme/Allergies:       Negative for hypoglycemia.   PHYSICAL EXAM: Blood pressure (!) 142/84, pulse 86, temperature 98.2 F (36.8 C), temperature source Oral, height 5\' 11"  (1.803 m), weight (!) 388 lb (176 kg), SpO2 96 %. Body mass index is 54.12 kg/m. Physical  Exam Vitals signs reviewed.  Constitutional:      Appearance: Normal appearance. He is obese.  Cardiovascular:     Rate and Rhythm: Normal rate.     Pulses: Normal pulses.  Pulmonary:     Effort: Pulmonary effort is normal.     Breath sounds: Normal breath sounds.  Musculoskeletal: Normal range of motion.  Skin:    General: Skin is warm and dry.  Neurological:     Mental Status: He is alert and oriented to person, place, and time.  Psychiatric:        Behavior: Behavior normal.   RECENT LABS AND TESTS: BMET    Component Value Date/Time   NA 140 07/10/2019 0840   K 4.5 07/10/2019 0840   CL 104 07/10/2019 0840   CO2 20 07/10/2019 0840   GLUCOSE 111 (H) 07/10/2019 0840   GLUCOSE 110 (H) 09/14/2016 1210   BUN 13 07/10/2019 0840   CREATININE 0.86 07/10/2019 0840   CALCIUM 9.6 07/10/2019 0840   GFRNONAA 104 07/10/2019 0840   GFRAA 120 07/10/2019 0840   Lab Results  Component Value Date   HGBA1C 5.8 (H) 07/10/2019   HGBA1C 6.0 (H) 06/11/2010   HGBA1C 5.9 10/02/2008   Lab Results  Component Value Date   INSULIN 42.3 (H) 07/10/2019   CBC    Component Value Date/Time   WBC 9.2 09/14/2016 1210   RBC 5.09 09/14/2016 1210   HGB 14.1 09/14/2016 1210   HCT 43.3 09/14/2016 1210   PLT 357 09/14/2016 1210   MCV 85.1 09/14/2016 1210   MCH 27.7 09/14/2016 1210   MCHC 32.6 09/14/2016 1210   RDW 14.3 09/14/2016 1210   LYMPHSABS 2.2 09/09/2016 0009   MONOABS 0.7 09/09/2016 0009   EOSABS 0.0 09/09/2016 0009   BASOSABS 0.0 09/09/2016 0009   Iron/TIBC/Ferritin/ %Sat    Component Value Date/Time   IRON 49 10/15/2008 0845   FERRITIN 52.6 10/15/2008 0845   IRONPCTSAT 10.7 (L) 10/15/2008 0845   Lipid Panel     Component Value Date/Time   CHOL 171 10/02/2008 1152   TRIG 82 10/02/2008 1152   HDL 26.3 (L) 10/02/2008 1152   CHOLHDL 6.5 CALC 10/02/2008 1152   VLDL 16 10/02/2008 1152   LDLCALC 128 (H) 10/02/2008 1152   Hepatic Function Panel     Component Value Date/Time    PROT 6.9 07/10/2019 0840   ALBUMIN 4.5 07/10/2019 0840   AST 14 07/10/2019 0840   ALT 22 07/10/2019 0840   ALKPHOS 83 07/10/2019 0840   BILITOT 0.5 07/10/2019 0840   BILIDIR 0.1 05/28/2010 2008   IBILI 0.3 05/28/2010 2008      Component Value Date/Time   TSH 2.535 05/28/2010 2008   TSH 0.83 10/02/2008 1152   Results for SUHEYB, RAUCCI (MRN [id number]) as of 08/14/2019 16:07  Ref. Range 07/10/2019 08:40  Vitamin D, 25-Hydroxy Latest Ref Range: 30.0 - 100.0 ng/mL 17.5 (L)   OBESITY BEHAVIORAL INTERVENTION VISIT  Today's visit was #3  Starting weight: 380 lbs Starting date: 07/10/2019 Today's weight: 388 lbs  Today's date: 08/10/2019 Total lbs lost to date: 0    08/10/2019  Height 5\' 11"  (1.803 m)  Weight 388 lb (176 kg) (A)  BMI (Calculated) 54.14  BLOOD PRESSURE - SYSTOLIC 142  BLOOD PRESSURE - DIASTOLIC 84   Body Fat % 46.2 %  Total Body Water (lbs) 156.2 lbs   ASK: We discussed the diagnosis of obesity with today and Rande agreed to give Tristan Lam permission to discuss obesity behavioral modification therapy today.  ASSESS: Marquise has the diagnosis of obesity and his BMI today is 54.2. Raequon is in the action stage of change.   ADVISE: Ethon was educated on the multiple health risks of obesity as well as the benefit of weight loss to improve his health. He was advised of the need for long term treatment and the importance of lifestyle modifications to improve his current health and to decrease his risk of future health problems.  AGREE: Multiple dietary modification options and treatment options were discussed and  Taige agreed to follow the recommendations documented in the above note.  ARRANGE: Tolbert was educated on the importance of frequent visits to treat obesity as outlined per CMS and USPSTF guidelines and agreed to schedule his next follow up appointment today.  Tristan Lam, am acting as Fernanda Drum for Energy manager, DO   I have reviewed the above documentation for accuracy and completeness, and I agree with the above. -[employer], DO

## 2019-08-16 ENCOUNTER — Other Ambulatory Visit (INDEPENDENT_AMBULATORY_CARE_PROVIDER_SITE_OTHER): Payer: Self-pay | Admitting: Bariatrics

## 2019-08-16 DIAGNOSIS — E559 Vitamin D deficiency, unspecified: Secondary | ICD-10-CM

## 2019-08-27 ENCOUNTER — Emergency Department (HOSPITAL_COMMUNITY)
Admission: EM | Admit: 2019-08-27 | Discharge: 2019-08-27 | Disposition: A | Discharge status: Home / Self Care | Payer: 59 | Attending: Emergency Medicine | Admitting: Emergency Medicine

## 2019-08-27 ENCOUNTER — Emergency Department (HOSPITAL_COMMUNITY): Payer: 59

## 2019-08-27 ENCOUNTER — Encounter (HOSPITAL_COMMUNITY): Payer: Self-pay

## 2019-08-27 DIAGNOSIS — X500XXA Overexertion from strenuous movement or load, initial encounter: Secondary | ICD-10-CM | POA: Insufficient documentation

## 2019-08-27 DIAGNOSIS — R0789 Other chest pain: Secondary | ICD-10-CM | POA: Insufficient documentation

## 2019-08-27 DIAGNOSIS — I10 Essential (primary) hypertension: Secondary | ICD-10-CM | POA: Insufficient documentation

## 2019-08-27 DIAGNOSIS — Y9389 Activity, other specified: Secondary | ICD-10-CM | POA: Insufficient documentation

## 2019-08-27 DIAGNOSIS — Z79899 Other long term (current) drug therapy: Secondary | ICD-10-CM | POA: Diagnosis not present

## 2019-08-27 DIAGNOSIS — R000 Tachycardia, unspecified: Secondary | ICD-10-CM | POA: Insufficient documentation

## 2019-08-27 LAB — TROPONIN I (HIGH SENSITIVITY)
Troponin I (High Sensitivity): 2 ng/L (ref <18)
Troponin I (High Sensitivity): 3 ng/L (ref <18)
Troponin I (High Sensitivity): 3 ng/L (ref <18)

## 2019-08-27 LAB — CBC
HCT: 46.8 % (ref 39.0–52.0)
Hemoglobin: 15 g/dL (ref 13.0–17.0)
MCH: 28.1 pg (ref 26.0–34.0)
MCHC: 32.1 g/dL (ref 30.0–36.0)
MCV: 87.8 fL (ref 80.0–100.0)
Platelets: 348 10*3/uL (ref 150–400)
RBC: 5.33 MIL/uL (ref 4.22–5.81)
RDW: 13.3 % (ref 11.5–15.5)
WBC: 9.2 10*3/uL (ref 4.0–10.5)
nRBC: 0 % (ref 0.0–0.2)

## 2019-08-27 LAB — BASIC METABOLIC PANEL
Anion gap: 9 (ref 5–15)
BUN: 14 mg/dL (ref 6–20)
CO2: 24 mmol/L (ref 22–32)
Calcium: 9.4 mg/dL (ref 8.9–10.3)
Chloride: 104 mmol/L (ref 98–111)
Creatinine, Ser: 0.85 mg/dL (ref 0.61–1.24)
GFR calc Af Amer: >60 mL/min (ref >60)
GFR calc non Af Amer: >60 mL/min (ref >60)
Glucose, Bld: 99 mg/dL (ref 70–99)
Potassium: 4.2 mmol/L (ref 3.5–5.1)
Sodium: 137 mmol/L (ref 135–145)

## 2019-08-27 LAB — D-DIMER, QUANTITATIVE: D-Dimer, Quant: <0.27 ug/mL-FEU (ref 0.00–0.50)

## 2019-08-27 MED ORDER — KETOROLAC TROMETHAMINE 30 MG/ML IJ SOLN
30.0000 mg | Freq: Once | INTRAMUSCULAR | Status: AC
  Administered 2019-08-27: 30 mg via INTRAVENOUS
  Filled 2019-08-27: qty 1

## 2019-08-27 MED ORDER — SODIUM CHLORIDE 0.9% FLUSH: 3.0000 mL | Freq: Once | INTRAVENOUS | Status: DC

## 2019-08-27 MED ORDER — MELOXICAM 7.5 MG PO TABS: 7.5000 mg | ORAL_TABLET | Freq: Every day | ORAL | 0 refills | Status: DC

## 2019-08-27 NOTE — Discharge Instructions (Signed)
Take the anti-inflammatories as prescribed.  If you develop chest pain that radiates into her left arm, left neck, lightheadedness, dizziness, vomiting, chest pain that is exertional in nature please seek reevaluation.  Otherwise follow-up with your PCP for reevaluation.

## 2019-08-27 NOTE — ED Provider Notes (Signed)
[facility] DEPT Provider Note   CSN: [id number] Arrival date & time: 08/27/19  1328     History   Chief Complaint Chief Complaint  Patient presents with  . Chest Pain    HPI Tristan Perez is a 48 y.o. male with past medical history significant for hypertension, obesity who presents for evaluation of chest pain.  Patient states he has had left-sided chest pain x1 week.  Pain intermittent in nature.  States pain worse with deep breathing and movement.  He took ibuprofen yesterday with mild relief of pain.  Patient states he was recently laid off from work and has been packing and carrying boxes which he is not used to.  Patient states he also did play basketball with some 1 week ago and he is not used to in his arms in any sort of exercise.  Denies radiation of pain to his jaw or left arm.  He denies any associated lightheadedness, dizziness, diaphoresis, nausea, vomiting.  Chest pain is nonexertional in nature.  No prior history of PE, DVT.  No recent surgery, mobilization, history of malignancy redness, swelling, warmth to extremities.  Has not followed with cardiology previously.  Denies additional agrivating or alleviating factors.  History obtained from patient and past medical records.  No interpretor was used.     HPI  Past Medical History:  Diagnosis Date  . ADHD   . Constipation    AND ALTERNATES WITH DIARRHEA AND SOME BLOOD SINCE END OF JUNE 2020  . Eczema   . Fatty liver   . High blood pressure   . History of kidney stones   . History of urinary tract infection   . Hypertension    Pt states was on beta blocker 10 years ago but took only for 6 months   . Kidney stones   . Obesity   . Palpitations   . Prediabetes     Patient Active Problem List   Diagnosis Date Noted  . Prediabetes 07/11/2019  . Class 3 severe obesity with serious comorbidity and body mass index (BMI) of 50.0 to 59.9 in adult ([place]) 07/11/2019  . Vitamin D  deficiency 07/11/2019  . ALLERGIC RHINITIS 06/13/2010  . SNORING 06/13/2010  . HYPERGLYCEMIA 06/03/2010  . HYPERTENSION 05/28/2010  . FATIGUE 05/28/2010  . CHEST WALL PAIN, ANTERIOR 06/20/2009  . TRANSAMINASES, SERUM, ELEVATED 10/15/2008  . MORBID OBESITY 08/17/2008    Past Surgical History:  Procedure Laterality Date  . COLONOSCOPY WITH PROPOFOL N/A 06/22/2019   Procedure: COLONOSCOPY WITH PROPOFOL;  Surgeon: Laurence Spates, MD;  Location: [facility];  Service: Endoscopy;  Laterality: N/A;  . CYSTOSCOPY/URETEROSCOPY/HOLMIUM LASER/STENT PLACEMENT Left 09/14/2016   Procedure: CYSTOSCOPY/RETROGRADES/URETEROSCOPY/HOLMIUM LASER/STENT PLACEMENT;  Surgeon: Raynelle Bring, MD;  Location: [facility];  Service: Urology;  Laterality: Left;  . extraction of wisdom teeth          Home Medications    Prior to Admission medications   Medication Sig Start Date End Date Taking? Authorizing Provider  ibuprofen (ADVIL) 200 MG tablet Take 400 mg by mouth every 6 (six) hours as needed for headache or moderate pain.    [provider]  meloxicam (MOBIC) 7.5 MG tablet Take 1 tablet (7.5 mg total) by mouth daily. 08/27/19   Lylian Sanagustin A, PA-C  polyethylene glycol (MIRALAX / GLYCOLAX) 17 g packet Take 17 g by mouth every evening.    [provider]  Vitamin D, Ergocalciferol, (DRISDOL) 1.25 MG (50000 UT) CAPS capsule Take 1 capsule (50,000  Units total) by mouth every 7 (seven) days. 07/24/19   Roswell Nickel, DO    Family History Family History  Problem Relation Age of Onset  . Diabetes Father   . Hypertension Father   . Obesity Father   . Stroke Other   . Hyperlipidemia Mother     Social History Social History   Tobacco Use  . Smoking status: Never Smoker  . Smokeless tobacco: Never Used  Substance Use Topics  . Alcohol use: No  . Drug use: No     Allergies   Patient has no known allergies.   Review of Systems Review of Systems  Constitutional: Negative.    HENT: Negative.   Eyes: Negative.   Respiratory: Negative.   Cardiovascular: Positive for chest pain. Negative for palpitations and leg swelling.  Gastrointestinal: Negative.   Genitourinary: Negative.   Musculoskeletal: Negative.   Neurological: Negative.   All other systems reviewed and are negative.    Physical Exam Updated Vital Signs BP (!) 159/91   Pulse (!) 108   Temp 98.7 F (37.1 C) (Oral)   Resp 17   Wt (!) 176 kg   SpO2 96%   BMI 54.12 kg/m   Physical Exam Vitals signs and nursing note reviewed.  Constitutional:      General: He is not in acute distress.    Appearance: He is well-developed. He is obese. He is not ill-appearing, toxic-appearing or diaphoretic.  HENT:     Head: Normocephalic and atraumatic.     Jaw: There is normal jaw occlusion.     Mouth/Throat:     Lips: Pink.     Mouth: Mucous membranes are moist.     Comments: Mucous membranes moist Eyes:     Extraocular Movements: Extraocular movements intact.     Pupils: Pupils are equal, round, and reactive to light.  Neck:     Musculoskeletal: Full passive range of motion without pain, normal range of motion and neck supple.     Vascular: No carotid bruit or JVD.     Trachea: Phonation normal.     Comments: No neck stiffness, neck rigidity. Cardiovascular:     Rate and Rhythm: Normal rate and regular rhythm.     Pulses: Normal pulses.          Dorsalis pedis pulses are 2+ on the right side and 2+ on the left side.       Posterior tibial pulses are 2+ on the right side and 2+ on the left side.     Heart sounds: Normal heart sounds.     Comments: No murmurs, rubs or gallops. Pulmonary:     Effort: Pulmonary effort is normal. No respiratory distress.     Breath sounds: Normal breath sounds and air entry.     Comments: Clear to auscultation bilaterally, rhonchi rales.  No sensory muscle usage.  Speaks in full sentences without difficulty. Chest:     Chest wall: Tenderness present. No mass,  deformity, swelling, crepitus or edema.     Breasts: Breasts are symmetrical.       Comments: Chest pain reproducible to palpation to left upper chest wall and under left axilla.  No obvious step-offs, crepitus, deformity. Abdominal:     General: Bowel sounds are normal. There is no distension.     Palpations: Abdomen is soft.     Tenderness: There is no abdominal tenderness.     Comments: Obese, nontender.  Normoactive bowel sounds  Musculoskeletal: Normal range of motion.  Comments: Moves all 4 extremities at difficulty. Non tender calves, homans sign negative  Lymphadenopathy:     Cervical: No cervical adenopathy.  Skin:    General: Skin is warm and dry.     Comments: Calves nontender bilaterally without edema, erythema or warmth. No fluctuance, induration, cellulitis   Neurological:     Mental Status: He is alert.     Comments: Gait intact without difficulty.    ED Treatments / Results  Labs (all labs ordered are listed, but only abnormal results are displayed) Labs Reviewed  BASIC METABOLIC PANEL  CBC  D-DIMER, QUANTITATIVE (NOT AT [facility])  TROPONIN I (HIGH SENSITIVITY)  TROPONIN I (HIGH SENSITIVITY)  TROPONIN I (HIGH SENSITIVITY)    EKG EKG Interpretation  Date/Time:  Sunday August 27 2019 13:36:46 EST Ventricular Rate:  96 PR Interval:    QRS Duration: 82 QT Interval:  341 QTC Calculation: 431 R Axis:   -28 Text Interpretation: Sinus rhythm Borderline left axis deviation Low voltage, precordial leads Consider anterior infarct Baseline wander in lead(s) V1 V2 V3 No significant change since last tracing Confirmed by Linwood DibblesKnapp, Jon [phone]) on 08/27/2019 7:26:42 PM   Radiology Dg Chest 2 View  Result Date: 08/27/2019 CLINICAL DATA:  Pt states that he has had left sided chest pain for several days. Pt states that pain is worse when lying down. Pt states that last weekend, he played basketball for the first time in "years", and has also been lifting heavy boxes at  work. H/o HTN. Nonsmoker. EXAM: CHEST - 2 VIEW COMPARISON:  12/28/2011 FINDINGS: Cardiac silhouette is normal in size. No mediastinal or hilar masses. No evidence of adenopathy. Clear lungs.  No pleural effusion or pneumothorax. Skeletal structures are intact. IMPRESSION: No active cardiopulmonary disease. Electronically Signed   By: Amie Portlandavid  Ormond M.D.   On: 08/27/2019 14:17    Procedures Procedures (including critical care time)  Medications Ordered in ED Medications  sodium chloride flush (NS) 0.9 % injection 3 mL (has no administration in time range)  ketorolac (TORADOL) 30 MG/ML injection 30 mg (30 mg Intravenous Given 08/27/19 2021)   Initial Impression / Assessment and Plan / ED Course  I have reviewed the triage vital signs and the nursing notes.  Pertinent labs & imaging results that were available during my care of the patient were reviewed by me and considered in my medical decision making (see chart for details).  48 year old male appears otherwise well presents for evaluation of left-sided chest pain.  He is afebrile, nonseptic, non-ill-appearing.  There is mild pleuritic component as well as chest pain which is reproducible to palpation.  He has no evidence of DVT on exam.  Chest pain is not exertional in nature, without associated nausea, vomiting, diaphoresis.  Chest pain does not radiate. He is mild tachycardic in room to 104. Cannot PERC however Wells criteria low risk. No prior cardiac, pulmonary issues.  EKG without ST/T changes. No STEMI DG chest without cardiomegaly, pulmonary edema, pneumothorax, infiltrates CBC without leukocytosis CMP without acute findings Trop delta negative D-dimer negative.  Heart score 2  2300: Patient reevaluated.  Pain resolved with Toradol.  Highly suspect chest wall pain.  Will DC home with anti-inflammatories.  Low suspicion for ACS, PE, dissection, myocarditis, pericarditis.  Patient is to be discharged with recommendation to follow up  with PCP in regards to today's hospital visit. Chest pain is not likely of cardiac or pulmonary etiology d/t presentation, PERC negative, VSS, no tracheal deviation, no JVD or new murmur,  RRR, breath sounds equal bilaterally, EKG without acute abnormalities, negative troponin, and negative CXR. Pt has been advised to return to the ED if CP becomes exertional, associated with diaphoresis or nausea, radiates to left jaw/arm, worsens or becomes concerning in any way. Pt appears reliable for follow up and is agreeable to discharge.       Final Clinical Impressions(s) / ED Diagnoses   Final diagnoses:  Atypical chest pain    ED Discharge Orders         Ordered    meloxicam (MOBIC) 7.5 MG tablet  Daily     08/27/19 2308           Ayme Short A, PA-C 08/27/19 2311    Terrilee Files, MD 08/28/19 [phone]

## 2019-08-27 NOTE — ED Triage Notes (Signed)
Pt states that he has had left sided chest pain for several days. Pt states that pain is worse when lying down. Pt states that last weekend, he played basketball for the first time in "years", and has also been lifting heavy boxes at work.

## 2019-08-27 NOTE — ED Notes (Signed)
Pt c/o intermittent chest pain since Friday. Pt states pain originates beneath left arm pit and radiates towards left chest. PT states the pain is a dull aching pain. He denies anything making pain better or worse. Pt denies fever, nausea, vomiting, pt denies pain radiating towards jaw or left arm. Pt denies pain association with movement or breathing.  Pt states that he moved some boxes last Monday but did not have any associated pain.

## 2019-08-29 ENCOUNTER — Ambulatory Visit (INDEPENDENT_AMBULATORY_CARE_PROVIDER_SITE_OTHER): Payer: 59 | Admitting: Bariatrics

## 2019-08-29 ENCOUNTER — Encounter (INDEPENDENT_AMBULATORY_CARE_PROVIDER_SITE_OTHER): Payer: Self-pay | Admitting: Bariatrics

## 2019-08-29 ENCOUNTER — Other Ambulatory Visit: Payer: Self-pay

## 2019-08-29 VITALS — BP 124/80 | HR 78 | Temp 97.9°F | Ht 71.0 in | Wt 386.0 lb

## 2019-08-29 DIAGNOSIS — Z6841 Body Mass Index (BMI) 40.0 and over, adult: Secondary | ICD-10-CM

## 2019-08-29 DIAGNOSIS — R7401 Elevation of levels of liver transaminase levels: Secondary | ICD-10-CM

## 2019-08-29 DIAGNOSIS — Z9189 Other specified personal risk factors, not elsewhere classified: Secondary | ICD-10-CM

## 2019-08-29 DIAGNOSIS — E559 Vitamin D deficiency, unspecified: Secondary | ICD-10-CM

## 2019-08-29 DIAGNOSIS — R7402 Elevation of levels of lactic acid dehydrogenase (LDH): Secondary | ICD-10-CM

## 2019-08-29 MED ORDER — VITAMIN D (ERGOCALCIFEROL) 1.25 MG (50000 UNIT) PO CAPS: 50000.0000 [IU] | ORAL_CAPSULE | ORAL | 0 refills | Status: DC

## 2019-08-30 ENCOUNTER — Encounter (INDEPENDENT_AMBULATORY_CARE_PROVIDER_SITE_OTHER): Payer: Self-pay | Admitting: Bariatrics

## 2019-08-30 NOTE — Progress Notes (Signed)
Office: [phone]  /  Fax: [phone]   HPI:   Chief Complaint: OBESITY Tristan Perez is here to discuss his progress with his obesity treatment plan. He is on the Category 2 plan and is following his eating plan approximately 98% of the time. He states he is walking 20 minutes 5 times per week. Tristan Perez is down 2 lbs. He is doing well with his protein and water intake. His weight is (!) 386 lb (175.1 kg) today and has had a weight loss of 2 pounds over a period of 3 weeks since his last visit. He has lost 0 lbs since starting treatment with us.  Vitamin D deficiency Tristan Perez has a diagnosis of Vitamin D deficiency. He is currently taking prescription Vit D and denies nausea, vomiting or muscle weakness.  At risk for osteopenia and osteoporosis Tristan Perez is at higher risk of osteopenia and osteoporosis due to Vitamin D deficiency.   Elevated Transaminases ( stable ).  Tristan Perez has a history of elevated liver enzymes. He denies abdominal pain.   ASSESSMENT AND PLAN:  Vitamin D deficiency - Plan: Vitamin D, Ergocalciferol, (DRISDOL) 1.25 MG (50000 UT) CAPS capsule  TRANSAMINASES, SERUM, ELEVATED  At risk for osteoporosis  Class 3 severe obesity with serious comorbidity and body mass index (BMI) of 50.0 to 59.9 in adult, unspecified obesity type ([facility])  PLAN:  Vitamin D Deficiency Tristan Perez was informed that low Vitamin D levels contributes to fatigue and are associated with obesity, breast, and colon cancer. He agrees to continue to take prescription Vit D @ 50,000 IU every week #4 with 0 refills and will follow-up for routine testing of Vitamin D, at least 2-3 times per year. He was informed of the risk of over-replacement of Vitamin D and agrees to not increase his dose unless he discusses this with us first. Tristan Perez agrees to follow-up with our clinic in 2-3 weeks.  At risk for osteopenia and osteoporosis Tristan Perez was given extended  (15 minutes) osteoporosis prevention counseling  today. Tristan Perez is at risk for osteopenia and osteoporosis due to his Vitamin D deficiency. He was encouraged to take his Vitamin D and follow his higher calcium diet and increase strengthening exercise to help strengthen his bones and decrease his risk of osteopenia and osteoporosis.  Elevated Transaminases Tristan Perez will follow-up with his PCP. He will increase his exercise and continue to work on weight loss. He was made aware that at least a 5 to 10 % weight loss and increasing exercise ( cardio and resistance ) of 150 minutes a week would be beneficial.   Obesity Tristan Perez is currently in the action stage of change. As such, his goal is to continue with weight loss efforts. He has agreed to follow the Category 2 plan. Tristan Perez will work on meal planning and will not have things in the house to tempt him. Tristan Perez has been instructed to continue walking and will do indoor gym for weight loss and overall health benefits. We discussed the following Behavioral Modification Strategies today: increasing lean protein intake, decreasing simple carbohydrates, increasing vegetables, increase H20 intake, work on meal planning and easy cooking plans, keeping healthy foods in the home, and ways to avoid boredom eating.  Tristan Perez has agreed to follow-up with our clinic in 2-3 weeks. He was informed of the importance of frequent follow-up visits to maximize his success with intensive lifestyle modifications for his multiple health conditions.  ALLERGIES: No Known Allergies  MEDICATIONS: Current Outpatient Medications on File Prior to Visit  Medication Sig  Dispense Refill   ibuprofen (ADVIL) 200 MG tablet Take 400 mg by mouth every 6 (six) hours as needed for headache or moderate pain.     meloxicam (MOBIC) 7.5 MG tablet Take 1 tablet (7.5 mg total) by mouth daily. 15 tablet 0   polyethylene glycol (MIRALAX / GLYCOLAX) 17 g packet Take 17 g by mouth every evening.     No current facility-administered  medications on file prior to visit.     PAST MEDICAL HISTORY: Past Medical History:  Diagnosis Date   ADHD    Constipation    AND ALTERNATES WITH DIARRHEA AND SOME BLOOD SINCE END OF JUNE 2020   Eczema    Fatty liver    High blood pressure    History of kidney stones    History of urinary tract infection    Hypertension    Pt states was on beta blocker 10 years ago but took only for 6 months    Kidney stones    Obesity    Palpitations    Prediabetes     PAST SURGICAL HISTORY: Past Surgical History:  Procedure Laterality Date   COLONOSCOPY WITH PROPOFOL N/A 06/22/2019   Procedure: COLONOSCOPY WITH PROPOFOL;  Surgeon: Carman Ching, MD;  Location: [facility];  Service: Endoscopy;  Laterality: N/A;   CYSTOSCOPY/URETEROSCOPY/HOLMIUM LASER/STENT PLACEMENT Left 09/14/2016   Procedure: CYSTOSCOPY/RETROGRADES/URETEROSCOPY/HOLMIUM LASER/STENT PLACEMENT;  Surgeon: Heloise Purpura, MD;  Location: [facility];  Service: Urology;  Laterality: Left;   extraction of wisdom teeth      SOCIAL HISTORY: Social History   Tobacco Use   Smoking status: Never Smoker   Smokeless tobacco: Never Used  Substance Use Topics   Alcohol use: No   Drug use: No    FAMILY HISTORY: Family History  Problem Relation Age of Onset   Diabetes Father    Hypertension Father    Obesity Father    Stroke Other    Hyperlipidemia Mother    ROS: Review of Systems  Gastrointestinal: Negative for nausea and vomiting.  Musculoskeletal:       Negative for muscle weakness.   PHYSICAL EXAM: Blood pressure 124/80, pulse 78, temperature 97.9 F (36.6 C), height 5\' 11"  (1.803 m), weight (!) 386 lb (175.1 kg), SpO2 97 %. Body mass index is 53.84 kg/m. Physical Exam Vitals signs reviewed.  Constitutional:      Appearance: Normal appearance. He is obese.  Cardiovascular:     Rate and Rhythm: Normal rate.     Pulses: Normal pulses.  Pulmonary:     Effort: Pulmonary effort is normal.      Breath sounds: Normal breath sounds.  Musculoskeletal: Normal range of motion.  Skin:    General: Skin is warm and dry.  Neurological:     Mental Status: He is alert and oriented to person, place, and time.  Psychiatric:        Behavior: Behavior normal.   RECENT LABS AND TESTS: BMET    Component Value Date/Time   NA 137 08/27/2019 1456   NA 140 07/10/2019 0840   K 4.2 08/27/2019 1456   CL 104 08/27/2019 1456   CO2 24 08/27/2019 1456   GLUCOSE 99 08/27/2019 1456   BUN 14 08/27/2019 1456   BUN 13 07/10/2019 0840   CREATININE 0.85 08/27/2019 1456   CALCIUM 9.4 08/27/2019 1456   GFRNONAA >60 08/27/2019 1456   GFRAA >60 08/27/2019 1456   Lab Results  Component Value Date   HGBA1C 5.8 (H) 07/10/2019   HGBA1C 6.0 (H)  06/11/2010   HGBA1C 5.9 10/02/2008   Lab Results  Component Value Date   INSULIN 42.3 (H) 07/10/2019   CBC    Component Value Date/Time   WBC 9.2 08/27/2019 1456   RBC 5.33 08/27/2019 1456   HGB 15.0 08/27/2019 1456   HCT 46.8 08/27/2019 1456   PLT 348 08/27/2019 1456   MCV 87.8 08/27/2019 1456   MCH 28.1 08/27/2019 1456   MCHC 32.1 08/27/2019 1456   RDW 13.3 08/27/2019 1456   LYMPHSABS 2.2 09/09/2016 0009   MONOABS 0.7 09/09/2016 0009   EOSABS 0.0 09/09/2016 0009   BASOSABS 0.0 09/09/2016 0009   Iron/TIBC/Ferritin/ %Sat    Component Value Date/Time   IRON 49 10/15/2008 0845   FERRITIN 52.6 10/15/2008 0845   IRONPCTSAT 10.7 (L) 10/15/2008 0845   Lipid Panel     Component Value Date/Time   CHOL 171 10/02/2008 1152   TRIG 82 10/02/2008 1152   HDL 26.3 (L) 10/02/2008 1152   CHOLHDL 6.5 CALC 10/02/2008 1152   VLDL 16 10/02/2008 1152   LDLCALC 128 (H) 10/02/2008 1152   Hepatic Function Panel     Component Value Date/Time   PROT 6.9 07/10/2019 0840   ALBUMIN 4.5 07/10/2019 0840   AST 14 07/10/2019 0840   ALT 22 07/10/2019 0840   ALKPHOS 83 07/10/2019 0840   BILITOT 0.5 07/10/2019 0840   BILIDIR 0.1 05/28/2010 2008   IBILI 0.3 05/28/2010  2008      Component Value Date/Time   TSH 2.535 05/28/2010 2008   TSH 0.83 10/02/2008 1152   Results for TYRA, MICHELLE (MRN [id number]) as of 08/30/2019 09:48  Ref. Range 07/10/2019 08:40  Vitamin D, 25-Hydroxy Latest Ref Range: 30.0 - 100.0 ng/mL 17.5 (L)   OBESITY BEHAVIORAL INTERVENTION VISIT  Today's visit was #4   Starting weight: 380 lbs Starting date: 07/10/2019 Today's weight: 386 lbs  Today's date: 08/29/2019 Total lbs lost to date: 0    08/29/2019  Height 5\' 11"  (1.803 m)  Weight 386 lb (175.1 kg) (A)  BMI (Calculated) 53.86  BLOOD PRESSURE - SYSTOLIC 408  BLOOD PRESSURE - DIASTOLIC 80   Body Fat % 14.4 %  Total Body Water (lbs) 150.2 lbs   ASK: We discussed the diagnosis of obesity with Tristan Perez today and Tristan Perez agreed to give [place] permission to discuss obesity behavioral modification therapy today.  ASSESS: Tristan Perez has the diagnosis of obesity and his BMI today is 53.8. Saben is in the action stage of change.   ADVISE: Kymoni was educated on the multiple health risks of obesity as well as the benefit of weight loss to improve his health. He was advised of the need for long term treatment and the importance of lifestyle modifications to improve his current health and to decrease his risk of future health problems.  AGREE: Multiple dietary modification options and treatment options were discussed and  Tristan Perez agreed to follow the recommendations documented in the above note.  ARRANGE: Tristan Perez was educated on the importance of frequent visits to treat obesity as outlined per CMS and USPSTF guidelines and agreed to schedule his next follow up appointment today.  Migdalia Dk, am acting as Location manager for [employer], DO  I have reviewed the above documentation for accuracy and completeness, and I agree with the above. -Jearld Lesch, DO

## 2019-09-25 ENCOUNTER — Ambulatory Visit (INDEPENDENT_AMBULATORY_CARE_PROVIDER_SITE_OTHER): Payer: 59 | Admitting: Bariatrics

## 2019-09-25 ENCOUNTER — Encounter (INDEPENDENT_AMBULATORY_CARE_PROVIDER_SITE_OTHER): Payer: Self-pay | Admitting: Bariatrics

## 2019-09-25 ENCOUNTER — Other Ambulatory Visit: Payer: Self-pay

## 2019-09-25 VITALS — BP 118/79 | HR 73 | Temp 98.0°F | Ht 71.0 in | Wt 386.0 lb

## 2019-09-25 DIAGNOSIS — Z6841 Body Mass Index (BMI) 40.0 and over, adult: Secondary | ICD-10-CM

## 2019-09-25 DIAGNOSIS — E559 Vitamin D deficiency, unspecified: Secondary | ICD-10-CM | POA: Diagnosis not present

## 2019-09-25 DIAGNOSIS — I10 Essential (primary) hypertension: Secondary | ICD-10-CM

## 2019-09-25 NOTE — Progress Notes (Signed)
Office: [phone]  /  Fax: [phone]   HPI:   Chief Complaint: OBESITY Tristan Perez is here to discuss his progress with his obesity treatment plan. He is on the Category 2 plan and is following his eating plan approximately 90% of the time. He states he is exercising 0 minutes 0 times per week. Tristan Perez' weight has remained the same. He is doing well with water and protein. He had "shingles" and did not feel like exercising.  His weight is (!) 386 lb (175.1 kg) today and has not lost weight since his last visit. He has lost 0 lbs since starting treatment with us.  Hypertension Tristan Perez is a 48 y.o. male with hypertension and is taking no medications. Tristan Perez denies chest pain or shortness of breath on exertion. No lightheadedness. He is working weight loss to help control his blood pressure with the goal of decreasing his risk of heart attack and stroke. Tristan Perez' blood pressure is controlled at 118/79.  Vitamin D deficiency Tristan Perez has a diagnosis of Vitamin D deficiency. He is currently taking Vit D and denies nausea, vomiting or muscle weakness.  ASSESSMENT AND PLAN:  Essential hypertension  Vitamin D deficiency  Class 3 severe obesity with serious comorbidity and body mass index (BMI) of 50.0 to 59.9 in adult, unspecified obesity type (HCC)  PLAN:  Hypertension We discussed sodium restriction, working on healthy weight loss, and a regular exercise program as the means to achieve improved blood pressure control. Tristan Perez agreed with this plan and agreed to follow up as directed. We will continue to monitor his blood pressure as well as his progress with the above lifestyle modifications. He will continue to work on diet and exercise and will watch for signs of hypotension as he continues his lifestyle modifications.  Vitamin D Deficiency Tristan Perez was informed that low Vitamin D levels contributes to fatigue and are associated with obesity, breast, and colon  cancer. He agrees to continue taking Vit D and will follow-up for routine testing of Vitamin D, at least 2-3 times per year. He was informed of the risk of over-replacement of Vitamin D and agrees to not increase his dose unless he discusses this with us first. Tristan Perez agrees to follow-up with our clinic in 2-3 weeks.  I spent > than 50% of the 20 minute visit on counseling as documented in the note.  Obesity Tristan Perez is currently in the action stage of change. As such, his goal is to continue with weight loss efforts. He has agreed to follow the Category 2 plan. Tristan Perez will work on meal planning, increasing his water intake, and increasing his walking. Tristan Perez has been unable to walk due to shingles.  We discussed the following Behavioral Modification Strategies today: increasing lean protein intake, decreasing simple carbohydrates, increasing vegetables, increase H20 intake, decrease eating out, no skipping meals, work on meal planning and easy cooking plans, celebration eating strategies, and avoiding temptations.  Tristan Perez has agreed to follow-up with our clinic in 2-3 weeks. He was informed of the importance of frequent follow-up visits to maximize his success with intensive lifestyle modifications for his multiple health conditions.  ALLERGIES: No Known Allergies  MEDICATIONS: Current Outpatient Medications on File Prior to Visit  Medication Sig Dispense Refill  . ibuprofen (ADVIL) 200 MG tablet Take 400 mg by mouth every 6 (six) hours as needed for headache or moderate pain.    . meloxicam (MOBIC) 7.5 MG tablet Take 1 tablet (7.5 mg total) by mouth daily. 15  tablet 0  . polyethylene glycol (MIRALAX / GLYCOLAX) 17 g packet Take 17 g by mouth every evening.    . Vitamin D, Ergocalciferol, (DRISDOL) 1.25 MG (50000 UT) CAPS capsule Take 1 capsule (50,000 Units total) by mouth every 7 (seven) days. 4 capsule 0   No current facility-administered medications on file prior to visit.      PAST MEDICAL HISTORY: Past Medical History:  Diagnosis Date  . ADHD   . Constipation    AND ALTERNATES WITH DIARRHEA AND SOME BLOOD SINCE END OF JUNE 2020  . Eczema   . Fatty liver   . High blood pressure   . History of kidney stones   . History of urinary tract infection   . Hypertension    Pt states was on beta blocker 10 years ago but took only for 6 months   . Kidney stones   . Obesity   . Palpitations   . Prediabetes     PAST SURGICAL HISTORY: Past Surgical History:  Procedure Laterality Date  . COLONOSCOPY WITH PROPOFOL N/A 06/22/2019   Procedure: COLONOSCOPY WITH PROPOFOL;  Surgeon: Carman Ching, MD;  Location: [facility];  Service: Endoscopy;  Laterality: N/A;  . CYSTOSCOPY/URETEROSCOPY/HOLMIUM LASER/STENT PLACEMENT Left 09/14/2016   Procedure: CYSTOSCOPY/RETROGRADES/URETEROSCOPY/HOLMIUM LASER/STENT PLACEMENT;  Surgeon: Heloise Purpura, MD;  Location: [facility];  Service: Urology;  Laterality: Left;  . extraction of wisdom teeth      SOCIAL HISTORY: Social History   Tobacco Use  . Smoking status: Never Smoker  . Smokeless tobacco: Never Used  Substance Use Topics  . Alcohol use: No  . Drug use: No    FAMILY HISTORY: Family History  Problem Relation Age of Onset  . Diabetes Father   . Hypertension Father   . Obesity Father   . Stroke Other   . Hyperlipidemia Mother    ROS: Review of Systems  Respiratory: Negative for shortness of breath.   Cardiovascular: Negative for chest pain.  Gastrointestinal: Negative for nausea and vomiting.  Musculoskeletal:       Negative for muscle weakness.  Neurological:       Negative for lightheadedness.   PHYSICAL EXAM: Blood pressure 118/79, pulse 73, temperature 98 F (36.7 C), height 5\' 11"  (1.803 m), weight (!) 386 lb (175.1 kg), SpO2 97 %. Body mass index is 53.84 kg/m. Physical Exam Vitals signs reviewed.  Constitutional:      Appearance: Normal appearance. He is obese.  Cardiovascular:     Rate and  Rhythm: Normal rate.     Pulses: Normal pulses.  Pulmonary:     Effort: Pulmonary effort is normal.     Breath sounds: Normal breath sounds.  Musculoskeletal: Normal range of motion.  Skin:    General: Skin is warm and dry.  Neurological:     Mental Status: He is alert and oriented to person, place, and time.  Psychiatric:        Behavior: Behavior normal.   RECENT LABS AND TESTS: BMET    Component Value Date/Time   NA 137 08/27/2019 1456   NA 140 07/10/2019 0840   K 4.2 08/27/2019 1456   CL 104 08/27/2019 1456   CO2 24 08/27/2019 1456   GLUCOSE 99 08/27/2019 1456   BUN 14 08/27/2019 1456   BUN 13 07/10/2019 0840   CREATININE 0.85 08/27/2019 1456   CALCIUM 9.4 08/27/2019 1456   GFRNONAA >60 08/27/2019 1456   GFRAA >60 08/27/2019 1456   Lab Results  Component Value Date   HGBA1C  5.8 (H) 07/10/2019   HGBA1C 6.0 (H) 06/11/2010   HGBA1C 5.9 10/02/2008   Lab Results  Component Value Date   INSULIN 42.3 (H) 07/10/2019   CBC    Component Value Date/Time   WBC 9.2 08/27/2019 1456   RBC 5.33 08/27/2019 1456   HGB 15.0 08/27/2019 1456   HCT 46.8 08/27/2019 1456   PLT 348 08/27/2019 1456   MCV 87.8 08/27/2019 1456   MCH 28.1 08/27/2019 1456   MCHC 32.1 08/27/2019 1456   RDW 13.3 08/27/2019 1456   LYMPHSABS 2.2 09/09/2016 0009   MONOABS 0.7 09/09/2016 0009   EOSABS 0.0 09/09/2016 0009   BASOSABS 0.0 09/09/2016 0009   Iron/TIBC/Ferritin/ %Sat    Component Value Date/Time   IRON 49 10/15/2008 0845   FERRITIN 52.6 10/15/2008 0845   IRONPCTSAT 10.7 (L) 10/15/2008 0845   Lipid Panel     Component Value Date/Time   CHOL 171 10/02/2008 1152   TRIG 82 10/02/2008 1152   HDL 26.3 (L) 10/02/2008 1152   CHOLHDL 6.5 CALC 10/02/2008 1152   VLDL 16 10/02/2008 1152   LDLCALC 128 (H) 10/02/2008 1152   Hepatic Function Panel     Component Value Date/Time   PROT 6.9 07/10/2019 0840   ALBUMIN 4.5 07/10/2019 0840   AST 14 07/10/2019 0840   ALT 22 07/10/2019 0840    ALKPHOS 83 07/10/2019 0840   BILITOT 0.5 07/10/2019 0840   BILIDIR 0.1 05/28/2010 2008   IBILI 0.3 05/28/2010 2008      Component Value Date/Time   TSH 2.535 05/28/2010 2008   TSH 0.83 10/02/2008 1152   Results for LUISDAVID, HAMBLIN (MRN [id number]) as of 09/25/2019 10:04  Ref. Range 07/10/2019 08:40  Vitamin D, 25-Hydroxy Latest Ref Range: 30.0 - 100.0 ng/mL 17.5 (L)   OBESITY BEHAVIORAL INTERVENTION VISIT  Today's visit was #5  Starting weight: 380 lbs Starting date: 07/10/2019 Today's weight: 386 lbs  Today's date: 09/25/2019 Total lbs lost to date: 0    09/25/2019  Height 5\' 11"  (1.803 m)  Weight 386 lb (175.1 kg) (A)  BMI (Calculated) 53.86  BLOOD PRESSURE - SYSTOLIC 510  BLOOD PRESSURE - DIASTOLIC 79   Body Fat % 25.8 %  Total Body Water (lbs) 148.2 lbs   ASK: We discussed the diagnosis of obesity with Marny Lowenstein today and Amaury agreed to give [place] permission to discuss obesity behavioral modification therapy today.  ASSESS: [place] has the diagnosis of obesity and his BMI today is 53.9. Juma is in the action stage of change.   ADVISE: Colm was educated on the multiple health risks of obesity as well as the benefit of weight loss to improve his health. He was advised of the need for long term treatment and the importance of lifestyle modifications to improve his current health and to decrease his risk of future health problems.  AGREE: Multiple dietary modification options and treatment options were discussed and  Shadee agreed to follow the recommendations documented in the above note.  ARRANGE: Dovber was educated on the importance of frequent visits to treat obesity as outlined per CMS and USPSTF guidelines and agreed to schedule his next follow up appointment today.  Migdalia Dk, am acting as Location manager for [employer], DO   I have reviewed the above documentation for accuracy and completeness, and I agree with the above. -Jearld Lesch, DO

## 2019-10-16 ENCOUNTER — Ambulatory Visit (INDEPENDENT_AMBULATORY_CARE_PROVIDER_SITE_OTHER): Payer: 59 | Admitting: Physician Assistant

## 2020-01-20 ENCOUNTER — Ambulatory Visit: Payer: 59 | Attending: Internal Medicine

## 2020-01-20 DIAGNOSIS — Z23 Encounter for immunization: Secondary | ICD-10-CM

## 2020-01-20 NOTE — Progress Notes (Signed)
   Covid-19 Vaccination Clinic  Name:  Tristan Perez    MRN: [id number] DOB: [birth year]  01/20/2020  [name] was observed post Covid-19 immunization for 15 minutes without incident. He was provided with Vaccine Information Sheet and instruction to access the V-Safe system.   [name] was instructed to call 911 with any severe reactions post vaccine: Marland Kitchen Difficulty breathing  . Swelling of face and throat  . A fast heartbeat  . A bad rash all over body  . Dizziness and weakness   Immunizations Administered    Name Date Dose VIS Date Route   Pfizer COVID-19 Vaccine 01/20/2020 11:57 AM 0.3 mL 10/06/2019 Intramuscular   Manufacturer: [employer], [employer]   Lot: [id number]   NDC: [address]-2

## 2020-02-13 ENCOUNTER — Ambulatory Visit: Payer: 59 | Attending: Internal Medicine

## 2020-02-13 DIAGNOSIS — Z23 Encounter for immunization: Secondary | ICD-10-CM

## 2020-02-13 NOTE — Progress Notes (Signed)
   Covid-19 Vaccination Clinic  Name:  Tristan Perez    MRN: [id number] DOB: 1971/03/24  02/13/2020  Tristan Perez was observed post Covid-19 immunization for 15 minutes without incident. He was provided with Vaccine Information Sheet and instruction to access the V-Safe system.   Tristan Perez was instructed to call 911 with any severe reactions post vaccine: Marland Kitchen Difficulty breathing  . Swelling of face and throat  . A fast heartbeat  . A bad rash all over body  . Dizziness and weakness   Immunizations Administered    Name Date Dose VIS Date Route   Pfizer COVID-19 Vaccine 02/13/2020  1:14 PM 0.3 mL 12/20/2018 Intramuscular   Manufacturer: [employer], [employer]   Lot: [id number]   NDC: [address]-7

## 2020-05-28 ENCOUNTER — Encounter: Payer: Self-pay | Admitting: Physical Therapy

## 2020-05-28 ENCOUNTER — Ambulatory Visit: Payer: No Typology Code available for payment source | Attending: Family Medicine | Admitting: Physical Therapy

## 2020-05-28 ENCOUNTER — Other Ambulatory Visit: Payer: Self-pay

## 2020-05-28 DIAGNOSIS — R293 Abnormal posture: Secondary | ICD-10-CM | POA: Diagnosis present

## 2020-05-28 DIAGNOSIS — M5442 Lumbago with sciatica, left side: Secondary | ICD-10-CM | POA: Diagnosis present

## 2020-05-28 NOTE — Therapy (Signed)
[facility] [address], [address] [place], [place], [address] Phone: [phone]   Fax:  [phone]  Physical Therapy Evaluation  Patient Details  Name: Tristan Perez MRN: [id number] Date of Birth: [birth year] Referring Provider (PT): Farris Has, MD   Encounter Date: 05/28/2020   PT End of Session - 05/28/20 1159    Visit Number 1    Date for PT Re-Evaluation 07/09/20    Authorization Type AETNA    Authorization Time Period 05/28/20 to 07/09/20    PT Start Time 0930    PT Stop Time 1012    PT Time Calculation (min) 42 min    Activity Tolerance No increased pain;Patient tolerated treatment well    Behavior During Therapy [facility] for tasks assessed/performed           Past Medical History:  Diagnosis Date  . ADHD   . Constipation    AND ALTERNATES WITH DIARRHEA AND SOME BLOOD SINCE END OF JUNE 2020  . Eczema   . Fatty liver   . High blood pressure   . History of kidney stones   . History of urinary tract infection   . Hypertension    Pt states was on beta blocker 10 years ago but took only for 6 months   . Kidney stones   . Obesity   . Palpitations   . Prediabetes     Past Surgical History:  Procedure Laterality Date  . COLONOSCOPY WITH PROPOFOL N/A 06/22/2019   Procedure: COLONOSCOPY WITH PROPOFOL;  Surgeon: Carman Ching, MD;  Location: [facility];  Service: Endoscopy;  Laterality: N/A;  . CYSTOSCOPY/URETEROSCOPY/HOLMIUM LASER/STENT PLACEMENT Left 09/14/2016   Procedure: CYSTOSCOPY/RETROGRADES/URETEROSCOPY/HOLMIUM LASER/STENT PLACEMENT;  Surgeon: Heloise Purpura, MD;  Location: [facility];  Service: Urology;  Laterality: Left;  . extraction of wisdom teeth      There were no vitals filed for this visit.    Subjective Assessment - 05/28/20 0935    Subjective Pt states that he started to have pain in the Lt calf and buttock region when sititng for about 2-3 hours. He will also noticed tightening of his low back  when standing for longer periods of time. He works 8 hours a day and takes random breaks whenever he can.    Pertinent History obesity    Limitations Sitting;Standing    How long can you sit comfortably? 2-3 hours    How long can you stand comfortably? good as long as he is moving around    Patient Stated Goals improve movement/flexibility    Currently in Pain? 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-able to take periodic breaks if needed      Cognition   Overall Cognitive Status Within Functional Limits for tasks assessed      Sensation   Additional Comments denies numbness/tingling      Posture/Postural Control   Posture Comments sitting slouched, LE in front and knees extended  ROM / Strength   AROM / PROM / Strength AROM;Strength      AROM   Overall AROM Comments active lumbar flexion pulling bilateral hamstrings, Rt rotation (pulling Lt lateral hip), Lt without pain       Strength   Overall Strength Comments BLE within normal limits       Flexibility   Soft Tissue Assessment /Muscle Length yes    Hamstrings 90/90: Rt and Lt 45 deg       Palpation   Palpation comment non tender with palpation along Lt ITB/hamstring/gluteal region      Special Tests   Other special tests (+) SLUMP: neural tension Lt posterior thigh/calf                       Objective measurements completed on examination: See above findings.       [facility] Adult PT Treatment/Exercise - 05/28/20 0001      Self-Care   Self-Care Posture    Posture 10 minute break every 1 hour, seated towel roll and avoiding prolonged  knee extension to decrease nerve tension      Exercises   Exercises Knee/Hip      Knee/Hip Exercises: Stretches   Lobbyist Both;2 reps;10 seconds    Quad Stretch Limitations standing LE in chair       Knee/Hip Exercises: Supine   Other Supine Knee/Hip Exercises Lt sciatic nerve floss x4 reps towel for assistance                  PT Education - 05/28/20 1158    Education Details eval findings/POC; implemented HEP; desk posture and hourly breaks/change in position    Person(s) Educated Patient    Methods Explanation;Verbal cues;Handout    Comprehension Verbalized understanding;Returned demonstration            PT Short Term Goals - 05/28/20 1207      PT SHORT TERM GOAL #1   Title Pt will demo consistency and independence with his initial HEP to improve flexibility and understanding of proper desk ergonomics.    Time 3    Period Weeks    Status New    Target Date 06/18/20             PT Long Term Goals - 05/28/20 1208      PT LONG TERM GOAL #1   Title Pt will have improved hamstring flexibility, lacking no more than 30 deg of passive knee extension with 90/90 testing.    Time 6    Period Weeks    Status New    Target Date 07/09/20      PT LONG TERM GOAL #2   Title Pt will have improved understanding of position changes during the day, evident by purchasing a standing desk or atleast taking 10 minute break every 1 hour of work.    Time 6    Period Weeks    Status New      PT LONG TERM GOAL #3   Title Pt will report atleast 60% improvement in LE symptoms and low back pain throughout a work day.    Time 6    Period Weeks    Status New                  Plan - 05/28/20 1200    Clinical Impression Statement Pt is a pleasant 48y.o M referred to OPPT with complaints of Lt sided pain and intermittent LBP. His pain began  back in April, and this has improved some since then. He has pain on the back of the Lt calf, thigh and buttock region with  prolonged sitting 2-3hours and works 8 hours a day at his desk at home. Pt has positive slump test on the Lt with additional restrictions in hip flexibility. Although limited, he denied increase in low back pain with active lumbar ROM. Pt would benefit from skilled PT to educate on proper desk ergonomics/position changes as well as promote increase in overall trunk/back strength.    Personal Factors and Comorbidities Age;Fitness;Past/Current Experience    Examination-Activity Limitations Stand;Sit    Examination-Participation Restrictions Other   work   Stability/Clinical Decision Making Stable/Uncomplicated    Clinical Decision Making Low    Rehab Potential Good    PT Frequency 1x / week    PT Duration 6 weeks    PT Treatment/Interventions ADLs/Self Care Home Management;Electrical Stimulation;Moist Heat;Functional mobility training;Therapeutic activities;Therapeutic exercise;Dry needling;Neuromuscular re-education;Patient/family education    PT Next Visit Plan f/u on posture and regular breaks; encourage standing desk; begin standing trunk strength; hip rotation flexibility    PT Home Exercise Plan [email or web address]    Recommended Other Services none    Consulted and Agree with Plan of Care Patient           Patient will benefit from skilled therapeutic intervention in order to improve the following deficits and impairments:  Decreased activity tolerance, Impaired flexibility, Obesity, Decreased range of motion, Postural dysfunction, Pain, Improper body mechanics  Visit Diagnosis: Acute left-sided low back pain with left-sided sciatica  Abnormal posture     Problem List Patient Active Problem List   Diagnosis Date Noted  . Prediabetes 07/11/2019  . Class 3 severe obesity with serious comorbidity and body mass index (BMI) of 50.0 to 59.9 in adult (HCC) 07/11/2019  . Vitamin D deficiency 07/11/2019  . ALLERGIC RHINITIS 06/13/2010  . SNORING 06/13/2010  . HYPERGLYCEMIA 06/03/2010  .  HYPERTENSION 05/28/2010  . FATIGUE 05/28/2010  . CHEST WALL PAIN, ANTERIOR 06/20/2009  . TRANSAMINASES, SERUM, ELEVATED 10/15/2008  . MORBID OBESITY 08/17/2008    12:15 PM,05/28/20 Donita Brooks PT, DPT [facility]  [phone]  [facility] [address], [address] [place], [place], [address] Phone: [phone]   Fax:  [phone]  Name: EXANDER SHAUL MRN: [id number] Date of Birth: 01/28/1971

## 2020-05-28 NOTE — Patient Instructions (Signed)
Standing quad stretch 3x20 sec  Supine 90/90 nerve flossing x10 reps  [facility] [address] [place], [place] [address] Phone # [phone] Fax [phone]

## 2020-06-04 ENCOUNTER — Other Ambulatory Visit: Payer: Self-pay

## 2020-06-04 ENCOUNTER — Ambulatory Visit: Payer: No Typology Code available for payment source | Admitting: Physical Therapy

## 2020-06-04 ENCOUNTER — Encounter: Payer: Self-pay | Admitting: Physical Therapy

## 2020-06-04 DIAGNOSIS — R293 Abnormal posture: Secondary | ICD-10-CM

## 2020-06-04 DIAGNOSIS — M5442 Lumbago with sciatica, left side: Secondary | ICD-10-CM

## 2020-06-04 NOTE — Patient Instructions (Signed)
Access Code: [email or web address] URL: [email or web address] Date: 06/04/2020 Prepared by: [facility]  Exercises .Quadricep Stretch with Chair and Counter Support - 3 x daily - 7 x weekly - 3 reps - 20 seconds hold .Supine 90/90 Sciatic Nerve Glide with Knee Flexion/Extension - 3 x daily - 7 x weekly - 15 reps .Bent Knee Fallouts - 1 x daily - 7 x weekly - 3 sets - 10 reps   [facility] [address] [place], [place] [address] Phone # [phone] Fax [phone]

## 2020-06-04 NOTE — Therapy (Signed)
[facility] [address] [place], [place], [address] Phone: [phone]   Fax:  [phone]  Physical Therapy Treatment  Patient Details  Name: Tristan Perez MRN: [id number] Date of Birth: 07-29-1971 Referring Provider (PT): Farris Has, MD   Encounter Date: 06/04/2020   PT End of Session - 06/04/20 0939    Visit Number 2    Date for PT Re-Evaluation 07/09/20    Authorization Type AETNA    Authorization Time Period 05/28/20 to 07/09/20    PT Start Time 0847    PT Stop Time 0926    PT Time Calculation (min) 39 min    Activity Tolerance No increased pain;Patient tolerated treatment well    Behavior During Therapy [facility] for tasks assessed/performed           Past Medical History:  Diagnosis Date  . ADHD   . Constipation    AND ALTERNATES WITH DIARRHEA AND SOME BLOOD SINCE END OF JUNE 2020  . Eczema   . Fatty liver   . High blood pressure   . History of kidney stones   . History of urinary tract infection   . Hypertension    Pt states was on beta blocker 10 years ago but took only for 6 months   . Kidney stones   . Obesity   . Palpitations   . Prediabetes     Past Surgical History:  Procedure Laterality Date  . COLONOSCOPY WITH PROPOFOL N/A 06/22/2019   Procedure: COLONOSCOPY WITH PROPOFOL;  Surgeon: Carman Ching, MD;  Location: [facility];  Service: Endoscopy;  Laterality: N/A;  . CYSTOSCOPY/URETEROSCOPY/HOLMIUM LASER/STENT PLACEMENT Left 09/14/2016   Procedure: CYSTOSCOPY/RETROGRADES/URETEROSCOPY/HOLMIUM LASER/STENT PLACEMENT;  Surgeon: Heloise Purpura, MD;  Location: [facility];  Service: Urology;  Laterality: Left;  . extraction of wisdom teeth      There were no vitals filed for this visit.   Subjective Assessment - 06/04/20 0848    Subjective Pt states that things are going well. He is going to investigate his desk situation at home and felt the breaks were helpful.    Pertinent History obesity      Limitations Sitting;Standing    How long can you sit comfortably? 2-3 hours    How long can you stand comfortably? good as long as he is moving around    Patient Stated Goals improve movement/flexibility    Currently in Pain? No/denies                             [facility] Adult PT Treatment/Exercise - 06/04/20 0001      Knee/Hip Exercises: Stretches   Other Knee/Hip Stretches sciatic nerve flossing seated with LE on step and towel assist x10 reps each side     Other Knee/Hip Stretches trunk lateral flexion with UE on green physioball x8 reps       Knee/Hip Exercises: Standing   Other Standing Knee Exercises BUE pressdown with #35 2x10 reps, #45 x10 reps     Other Standing Knee Exercises standing hip IR/ER with knee on stool x10 reps each       Knee/Hip Exercises: Supine   Other Supine Knee/Hip Exercises single leg clam green TB 2x10 reps       Manual Therapy   Manual therapy comments passive Lt hip ER/IR in supine x3 reps 10 sec hold  PT Education - 06/04/20 0911    Education Details updated HEP    Person(s) Educated Patient    Methods Explanation;Handout    Comprehension Verbalized understanding            PT Short Term Goals - 05/28/20 1207      PT SHORT TERM GOAL #1   Title Pt will demo consistency and independence with his initial HEP to improve flexibility and understanding of proper desk ergonomics.    Time 3    Period Weeks    Status New    Target Date 06/18/20             PT Long Term Goals - 05/28/20 1208      PT LONG TERM GOAL #1   Title Pt will have improved hamstring flexibility, lacking no more than 30 deg of passive knee extension with 90/90 testing.    Time 6    Period Weeks    Status New    Target Date 07/09/20      PT LONG TERM GOAL #2   Title Pt will have improved understanding of position changes during the day, evident by purchasing a standing desk or atleast taking 10 minute break every 1 hour  of work.    Time 6    Period Weeks    Status New      PT LONG TERM GOAL #3   Title Pt will report atleast 60% improvement in LE symptoms and low back pain throughout a work day.    Time 6    Period Weeks    Status New                 Plan - 06/04/20 0940    Clinical Impression Statement Pt noticed improvements in his pain after adjusting his break schedule and posture. Session focused on increasing hip rotation and strength. Pt had notable difference between Lt and Rt hip ROM during supine clamshell. Pt was able to complete standing trunk strengthening exercises without increase in pain. Pt demonstrated understanding of his HEP updates made during today's session. Will continue with current POC.    Personal Factors and Comorbidities Age;Fitness;Past/Current Experience    Examination-Activity Limitations Stand;Sit    Examination-Participation Restrictions Other   work   Stability/Clinical Decision Making Stable/Uncomplicated    Rehab Potential Good    PT Frequency 1x / week    PT Duration 6 weeks    PT Treatment/Interventions ADLs/Self Care Home Management;Electrical Stimulation;Moist Heat;Functional mobility training;Therapeutic activities;Therapeutic exercise;Dry needling;Neuromuscular re-education;Patient/family education    PT Next Visit Plan f/u on standing desk; progress standing trunk strength; hip rotation flexibility    PT Home Exercise Plan [email or web address]    Consulted and Agree with Plan of Care Patient           Patient will benefit from skilled therapeutic intervention in order to improve the following deficits and impairments:  Decreased activity tolerance, Impaired flexibility, Obesity, Decreased range of motion, Postural dysfunction, Pain, Improper body mechanics  Visit Diagnosis: Acute left-sided low back pain with left-sided sciatica  Abnormal posture     Problem List Patient Active Problem List   Diagnosis Date Noted  . Prediabetes 07/11/2019  . Class 3  severe obesity with serious comorbidity and body mass index (BMI) of 50.0 to 59.9 in adult (HCC) 07/11/2019  . Vitamin D deficiency 07/11/2019  . ALLERGIC RHINITIS 06/13/2010  . SNORING 06/13/2010  . HYPERGLYCEMIA 06/03/2010  . HYPERTENSION 05/28/2010  . FATIGUE 05/28/2010  . CHEST WALL PAIN,  ANTERIOR 06/20/2009  . TRANSAMINASES, SERUM, ELEVATED 10/15/2008  . MORBID OBESITY 08/17/2008    10:07 AM,06/04/20 Donita Brooks PT, DPT [facility]  [phone]  [facility] [address] [place], [place], [address] Phone: [phone]   Fax:  [phone]  Name: Tristan Perez MRN: [id number] Date of Birth: 24-Jun-1971

## 2020-06-11 ENCOUNTER — Ambulatory Visit: Payer: No Typology Code available for payment source | Admitting: Physical Therapy

## 2020-06-18 ENCOUNTER — Other Ambulatory Visit: Payer: Self-pay

## 2020-06-18 ENCOUNTER — Ambulatory Visit: Payer: No Typology Code available for payment source

## 2020-06-18 DIAGNOSIS — M5442 Lumbago with sciatica, left side: Secondary | ICD-10-CM | POA: Diagnosis not present

## 2020-06-18 DIAGNOSIS — R293 Abnormal posture: Secondary | ICD-10-CM

## 2020-06-18 NOTE — Therapy (Signed)
[facility] [address] [place], [place], [address] Phone: [phone]   Fax:  [phone]  Physical Therapy Treatment  Patient Details  Name: Tristan Perez MRN: [id number] Date of Birth: 1970-11-25 Referring Provider (PT): Farris Has, MD   Encounter Date: 06/18/2020   PT End of Session - 06/18/20 0924    Visit Number 3    Date for PT Re-Evaluation 07/09/20    Authorization Type AETNA    PT Start Time 0847    PT Stop Time 0922    PT Time Calculation (min) 35 min    Activity Tolerance No increased pain;Patient tolerated treatment well    Behavior During Therapy [facility] for tasks assessed/performed           Past Medical History:  Diagnosis Date  . ADHD   . Constipation    AND ALTERNATES WITH DIARRHEA AND SOME BLOOD SINCE END OF JUNE 2020  . Eczema   . Fatty liver   . High blood pressure   . History of kidney stones   . History of urinary tract infection   . Hypertension    Pt states was on beta blocker 10 years ago but took only for 6 months   . Kidney stones   . Obesity   . Palpitations   . Prediabetes     Past Surgical History:  Procedure Laterality Date  . COLONOSCOPY WITH PROPOFOL N/A 06/22/2019   Procedure: COLONOSCOPY WITH PROPOFOL;  Surgeon: Carman Ching, MD;  Location: [facility];  Service: Endoscopy;  Laterality: N/A;  . CYSTOSCOPY/URETEROSCOPY/HOLMIUM LASER/STENT PLACEMENT Left 09/14/2016   Procedure: CYSTOSCOPY/RETROGRADES/URETEROSCOPY/HOLMIUM LASER/STENT PLACEMENT;  Surgeon: Heloise Purpura, MD;  Location: [facility];  Service: Urology;  Laterality: Left;  . extraction of wisdom teeth      There were no vitals filed for this visit.   Subjective Assessment - 06/18/20 0848    Subjective I am having less Lt leg pain after sitting for work.  50% overall improvement.    Currently in Pain? No/denies   up to 4/10 with sitting long periods                            [facility]  Adult PT Treatment/Exercise - 06/18/20 0001      Exercises   Exercises Lumbar      Lumbar Exercises: Stretches   Active Hamstring Stretch 3 reps;20 seconds;Left;Right      Lumbar Exercises: Supine   Ab Set 20 reps    AB Set Limitations 5" hold with ball squeeze-challenging for patient and demonstrated fatigue      Knee/Hip Exercises: Stretches   Other Knee/Hip Stretches sciatic nerve flossing  towel assist x10 reps on Lt.    Other Knee/Hip Stretches trunk lateral flexion with UE on green physioball 10" hold x 10      Knee/Hip Exercises: Standing   Other Standing Knee Exercises BUE pressdown with#45 2x10 reps with abdominal bracing      Knee/Hip Exercises: Seated   Abd/Adduction Limitations press down on foam roll 5"x10      Knee/Hip Exercises: Supine   Other Supine Knee/Hip Exercises single leg clam green TB 2x10 reps     Other Supine Knee/Hip Exercises hip ER stretch: single leg butterfly 3x20 seconds                  PT Education - 06/18/20 0914    Education Details Access Code: [email or web address]  Person(s) Educated Patient    Methods Explanation;Demonstration;Handout    Comprehension Returned demonstration;Verbalized understanding            PT Short Term Goals - 06/18/20 0855      PT SHORT TERM GOAL #1   Title Pt will demo consistency and independence with his initial HEP to improve flexibility and understanding of proper desk ergonomics.    Status Achieved             PT Long Term Goals - 05/28/20 1208      PT LONG TERM GOAL #1   Title Pt will have improved hamstring flexibility, lacking no more than 30 deg of passive knee extension with 90/90 testing.    Time 6    Period Weeks    Status New    Target Date 07/09/20      PT LONG TERM GOAL #2   Title Pt will have improved understanding of position changes during the day, evident by purchasing a standing desk or atleast taking 10 minute break every 1 hour of work.    Time 6    Period Weeks    Status  New      PT LONG TERM GOAL #3   Title Pt will report atleast 60% improvement in LE symptoms and low back pain throughout a work day.    Time 6    Period Weeks    Status New                 Plan - 06/18/20 4163    Clinical Impression Statement Pt reports 50% overall reduction in his pain after adjusting his break schedule and posture. Session focused on increasing hip and core flexibility and strength. Pt with improved Lt hip rotation and this appears almost equal to the Rt.  Pt was able to complete standing and trunk strengthening exercises without increase in pain. Pt demonstrated understanding of his HEP updates made during today's session. Pt will continue to benefit from skilled PT to address Lt LE radiculopathy and improve hip and core flexibility and strength for support during work tasks.    Rehab Potential Good    PT Frequency 1x / week    PT Duration 6 weeks    PT Treatment/Interventions ADLs/Self Care Home Management;Electrical Stimulation;Moist Heat;Functional mobility training;Therapeutic activities;Therapeutic exercise;Dry needling;Neuromuscular re-education;Patient/family education    PT Next Visit Plan progress hip and core strength, hip flexibility.  Pt working on getting a standing desk    PT Home Exercise Plan [email or web address]    Consulted and Agree with Plan of Care Patient           Patient will benefit from skilled therapeutic intervention in order to improve the following deficits and impairments:  Decreased activity tolerance, Impaired flexibility, Obesity, Decreased range of motion, Postural dysfunction, Pain, Improper body mechanics  Visit Diagnosis: Abnormal posture  Acute left-sided low back pain with left-sided sciatica     Problem List Patient Active Problem List   Diagnosis Date Noted  . Prediabetes 07/11/2019  . Class 3 severe obesity with serious comorbidity and body mass index (BMI) of 50.0 to 59.9 in adult (HCC) 07/11/2019  . Vitamin D  deficiency 07/11/2019  . ALLERGIC RHINITIS 06/13/2010  . SNORING 06/13/2010  . HYPERGLYCEMIA 06/03/2010  . HYPERTENSION 05/28/2010  . FATIGUE 05/28/2010  . CHEST WALL PAIN, ANTERIOR 06/20/2009  . TRANSAMINASES, SERUM, ELEVATED 10/15/2008  . MORBID OBESITY 08/17/2008     Lorrene Reid, PT 06/18/20 9:26 AM  [facility] [address] [place], [place], [address] Phone: [phone]   Fax:  [phone]  Name: Tristan Perez MRN: [id number] Date of Birth: 1971/03/25

## 2020-06-18 NOTE — Patient Instructions (Signed)
Access Code: [email or web address] URL: [email or web address] Date: 06/18/2020 Prepared by: Tresa Endo  Exercises   Supine Butterfly Groin Stretch - 1-2 x daily - 7 x weekly - 1 sets - 5 reps - 10 hold Supine Hip Adduction Isometric with Ball - 1 x daily - 7 x weekly - 2 sets - 10 reps - 5 hold

## 2020-06-25 ENCOUNTER — Encounter: Payer: Self-pay | Admitting: Physical Therapy

## 2020-06-25 ENCOUNTER — Ambulatory Visit: Payer: No Typology Code available for payment source | Admitting: Physical Therapy

## 2020-06-25 ENCOUNTER — Other Ambulatory Visit: Payer: Self-pay

## 2020-06-25 DIAGNOSIS — R293 Abnormal posture: Secondary | ICD-10-CM

## 2020-06-25 DIAGNOSIS — M5442 Lumbago with sciatica, left side: Secondary | ICD-10-CM | POA: Diagnosis not present

## 2020-06-25 NOTE — Therapy (Signed)
[facility] [address] [place], [place], [address] Phone: [phone]   Fax:  [phone]  Physical Therapy Treatment  Patient Details  Name: Tristan Perez MRN: [id number] Date of Birth: July 30, 1971 Referring Provider (PT): Farris Has, MD   Encounter Date: 06/25/2020   PT End of Session - 06/25/20 0854    Visit Number 4    Date for PT Re-Evaluation 07/09/20    Authorization Type AETNA    PT Start Time 0842    PT Stop Time 0922    PT Time Calculation (min) 40 min    Activity Tolerance No increased pain;Patient tolerated treatment well    Behavior During Therapy [facility] for tasks assessed/performed           Past Medical History:  Diagnosis Date   ADHD    Constipation    AND ALTERNATES WITH DIARRHEA AND SOME BLOOD SINCE END OF JUNE 2020   Eczema    Fatty liver    High blood pressure    History of kidney stones    History of urinary tract infection    Hypertension    Pt states was on beta blocker 10 years ago but took only for 6 months    Kidney stones    Obesity    Palpitations    Prediabetes     Past Surgical History:  Procedure Laterality Date   COLONOSCOPY WITH PROPOFOL N/A 06/22/2019   Procedure: COLONOSCOPY WITH PROPOFOL;  Surgeon: Carman Ching, MD;  Location: [facility];  Service: Endoscopy;  Laterality: N/A;   CYSTOSCOPY/URETEROSCOPY/HOLMIUM LASER/STENT PLACEMENT Left 09/14/2016   Procedure: CYSTOSCOPY/RETROGRADES/URETEROSCOPY/HOLMIUM LASER/STENT PLACEMENT;  Surgeon: Heloise Purpura, MD;  Location: [facility];  Service: Urology;  Laterality: Left;   extraction of wisdom teeth      There were no vitals filed for this visit.   Subjective Assessment - 06/25/20 0843    Subjective Pt states that his leg pain is improved. It is still in the same spot but it goes away after 2-3 minutes.    Currently in Pain? No/denies                             [facility] Adult PT  Treatment/Exercise - 06/25/20 0001      Lumbar Exercises: Standing   Other Standing Lumbar Exercises BUE pressdown with blue TB 3x10 reps     Other Standing Lumbar Exercises pallof press blue TB x10 reps each direction; tandem pallof press with green TB x10 reps       Knee/Hip Exercises: Stretches   Other Knee/Hip Stretches modified pigeon stretch with LE on mat table, PT raising table to increase stretch 5x10 sec hold each      Knee/Hip Exercises: Machines for Strengthening   Other Machine lat pulldown 2x10 reps #45      Knee/Hip Exercises: Standing   Other Standing Knee Exercises Rt and Lt hip IR/ER with red TB pulled medially-LE on 2nd step x15 reps      Knee/Hip Exercises: Seated   Hamstring Curl Strengthening;Both;1 set;10 reps    Hamstring Limitations green TB      Knee/Hip Exercises: Supine   Other Supine Knee/Hip Exercises single leg clam blue TB x10 reps, green TB x10 reps     Other Supine Knee/Hip Exercises hip ER stretch: single leg butterfly 3x20 seconds                  PT Education -  06/25/20 7858    Education Details updated HEP    Person(s) Educated Patient    Methods Explanation;Verbal cues;Handout    Comprehension Verbalized understanding;Returned demonstration            PT Short Term Goals - 06/18/20 0855      PT SHORT TERM GOAL #1   Title Pt will demo consistency and independence with his initial HEP to improve flexibility and understanding of proper desk ergonomics.    Status Achieved             PT Long Term Goals - 05/28/20 1208      PT LONG TERM GOAL #1   Title Pt will have improved hamstring flexibility, lacking no more than 30 deg of passive knee extension with 90/90 testing.    Time 6    Period Weeks    Status New    Target Date 07/09/20      PT LONG TERM GOAL #2   Title Pt will have improved understanding of position changes during the day, evident by purchasing a standing desk or atleast taking 10 minute break every 1 hour  of work.    Time 6    Period Weeks    Status New      PT LONG TERM GOAL #3   Title Pt will report atleast 60% improvement in LE symptoms and low back pain throughout a work day.    Time 6    Period Weeks    Status New                 Plan - 06/25/20 0854    Clinical Impression Statement Today's session continued with focus on increasing hip flexibility and strength. Pt had increased difficulty with single leg clamshell when resistance was added but was able to take this through his full hip rotation with PT cuing. PT made several updates to pts HEP to include more resistance exercises for hip and trunk strength. Pt denied pain during his session. Will continue with current POC.    Rehab Potential Good    PT Frequency 1x / week    PT Duration 6 weeks    PT Treatment/Interventions ADLs/Self Care Home Management;Electrical Stimulation;Moist Heat;Functional mobility training;Therapeutic activities;Therapeutic exercise;Dry needling;Neuromuscular re-education;Patient/family education    PT Next Visit Plan progress hip and core strength, hip flexibility.  Pt working on getting a standing desk    PT Home Exercise Plan [email or web address]    Consulted and Agree with Plan of Care Patient           Patient will benefit from skilled therapeutic intervention in order to improve the following deficits and impairments:  Decreased activity tolerance, Impaired flexibility, Obesity, Decreased range of motion, Postural dysfunction, Pain, Improper body mechanics  Visit Diagnosis: Abnormal posture  Acute left-sided low back pain with left-sided sciatica     Problem List Patient Active Problem List   Diagnosis Date Noted   Prediabetes 07/11/2019   Class 3 severe obesity with serious comorbidity and body mass index (BMI) of 50.0 to 59.9 in adult (HCC) 07/11/2019   Vitamin D deficiency 07/11/2019   ALLERGIC RHINITIS 06/13/2010   SNORING 06/13/2010   HYPERGLYCEMIA 06/03/2010   HYPERTENSION  05/28/2010   FATIGUE 05/28/2010   CHEST WALL PAIN, ANTERIOR 06/20/2009   TRANSAMINASES, SERUM, ELEVATED 10/15/2008   MORBID OBESITY 08/17/2008    9:28 AM,06/25/20 Donita Brooks PT, DPT [facility]  [phone]  [facility] [address] [place], [place], [address] Phone: [phone]   Fax:  [phone]  Name: Tristan Perez MRN: [id number] Date of Birth: 12-18-1970

## 2020-06-25 NOTE — Patient Instructions (Signed)
Access Code: [email or web address] URL: [email or web address] Date: 06/25/2020 Prepared by: [facility]  Exercises .Quadricep Stretch with Chair and Counter Support - 3 x daily - 7 x weekly - 3 reps - 20 seconds hold .Supine 90/90 Sciatic Nerve Glide with Knee Flexion/Extension - 3 x daily - 7 x weekly - 15 reps .Supine Butterfly Groin Stretch - 1-2 x daily - 7 x weekly - 1 sets - 5 reps - 10 hold .Supine Hip Adduction Isometric with Ball - 1 x daily - 7 x weekly - 2 sets - 10 reps - 5 hold .Hooklying Isometric Clamshell - 1 x daily - 7 x weekly - 2 sets - 10 reps .Standing Anti-Rotation Press with Anchored Resistance - 1 x daily - 7 x weekly - 1-2 sets - 10 reps .Single Arm Shoulder Extension with Anchored Resistance - 1 x daily - 7 x weekly - 3 sets - 10 reps    [facility] [address] [place], [place] [address] Phone # [phone] Fax [phone]

## 2020-07-02 ENCOUNTER — Ambulatory Visit: Payer: No Typology Code available for payment source | Admitting: Physical Therapy

## 2020-07-09 ENCOUNTER — Ambulatory Visit: Payer: No Typology Code available for payment source | Attending: Family Medicine | Admitting: Physical Therapy

## 2020-07-09 ENCOUNTER — Encounter: Payer: Self-pay | Admitting: Physical Therapy

## 2020-07-09 ENCOUNTER — Other Ambulatory Visit: Payer: Self-pay

## 2020-07-09 DIAGNOSIS — R293 Abnormal posture: Secondary | ICD-10-CM | POA: Diagnosis present

## 2020-07-09 DIAGNOSIS — M5442 Lumbago with sciatica, left side: Secondary | ICD-10-CM | POA: Diagnosis present

## 2020-07-09 NOTE — Patient Instructions (Signed)
Access Code: [email or web address]: [email or web address]: 09/14/2021Prepared by: [facility]  Quadricep Stretch with Chair and Counter Support - 3 x daily - 7 x weekly - 3 reps - 20 seconds hold  Supine Butterfly Groin Stretch - 1-2 x daily - 7 x weekly - 1 sets - 5 reps - 10 hold  Standing Anti-Rotation Press with Anchored Resistance - 1 x daily - 4 x weekly - 10 reps  Single Arm Shoulder Extension with Anchored Resistance - 1 x daily - 4 x weekly - 3 sets - 10 reps  Clamshell with Resistance - 1 x daily - 4 x weekly - 2 sets - 10 reps  Hip Abduction with Resistance Loop - 1 x daily - 4 x weekly - 2 sets - 10 reps   [facility] [address], [address] [place], [place] [address] Phone # [phone] Fax [phone]

## 2020-07-09 NOTE — Therapy (Signed)
[facility] [address], [address] [place], [place], [address] Phone: [phone]   Fax:  [phone]  Physical Therapy Treatment/Discharge  Patient Details  Name: Tristan Perez MRN: [id number] Date of Birth: 06/16/1971 Referring Provider (PT): Aaron Morrow, MD   Encounter Date: 07/09/2020   PT End of Session - 07/09/20 0839    Visit Number 5    Date for PT Re-Evaluation 07/09/20    Authorization Type AETNA    PT Start Time 0800    PT Stop Time 0840    PT Time Calculation (min) 40 min    Activity Tolerance No increased pain;Patient tolerated treatment well    Behavior During Therapy WFL for tasks assessed/performed           Past Medical History:  Diagnosis Date  . ADHD   . Constipation    AND ALTERNATES WITH DIARRHEA AND SOME BLOOD SINCE END OF JUNE 2020  . Eczema   . Fatty liver   . High blood pressure   . History of kidney stones   . History of urinary tract infection   . Hypertension    Pt states was on beta blocker 10 years ago but took only for 6 months   . Kidney stones   . Obesity   . Palpitations   . Prediabetes     Past Surgical History:  Procedure Laterality Date  . COLONOSCOPY WITH PROPOFOL N/A 06/22/2019   Procedure: COLONOSCOPY WITH PROPOFOL;  Surgeon: Edwards, James, MD;  Location: [facility];  Service: Endoscopy;  Laterality: N/A;  . CYSTOSCOPY/URETEROSCOPY/HOLMIUM LASER/STENT PLACEMENT Left 09/14/2016   Procedure: CYSTOSCOPY/RETROGRADES/URETEROSCOPY/HOLMIUM LASER/STENT PLACEMENT;  Surgeon: Lester Borden, MD;  Location: [facility];  Service: Urology;  Laterality: Left;  . extraction of wisdom teeth      There were no vitals filed for this visit.   Subjective Assessment - 07/09/20 0802    Subjective Pt states that things are going well. He is not have much pain during the day. He is getting the dimensions for his standing desk.    Currently in Pain? No/denies              [facility] PT  Assessment - 07/09/20 0001      Assessment   Medical Diagnosis Sciatica Lt side    Referring Provider (PT) Aaron Morrow, MD    Onset Date/Surgical Date --   April 2021   Prior Therapy none       Precautions   Precautions None      Restrictions   Weight Bearing Restrictions No      Prior Function   Vocation Requirements working from home: 8 hours a day at his desk-able to take periodic breaks if needed      Cognition   Overall Cognitive Status Within Functional Limits for tasks assessed      Observation/Other Assessments   Focus on Therapeutic Outcomes (FOTO)  6% limited      Sensation   Additional Comments denies numbness/tingling      Posture/Postural Control   Posture Comments sitting slouched, LE in front and knees extended      AROM   Overall AROM Comments active lumbar flexion pulling bilateral hamstrings, Rt rotation (pulling Lt lateral hip), Lt without pain       Strength   Overall Strength Comments BLE within normal limits       Flexibility   Soft Tissue Assessment /Muscle Length yes    Hamstrings 90/90: Rt and Lt 45 deg         Palpation   Palpation comment non tender with palpation along Lt ITB/hamstring/gluteal region      Special Tests   Other special tests (-) slump                         [facility] Adult PT Treatment/Exercise - 07/09/20 0001      Lumbar Exercises: Standing   Other Standing Lumbar Exercises pallof press hold with sidestepp 2x- 10 reps each direction; diagonal chops and lifts red TB each direction     Other Standing Lumbar Exercises BUE pressdown with blue TB x10 reps, increase to black TB 2x10 reps       Knee/Hip Exercises: Aerobic   Nustep L4 intervals x6 min PT adjusting HEP      Knee/Hip Exercises: Standing   Hip Abduction Stengthening;Right;Left;2 sets;10 reps    Hip Extension Stengthening;Both;2 sets;10 reps    Extension Limitations yellow TB      Knee/Hip Exercises: Sidelying   Clams 2x10 reps red TB-  compensations on Lt                   PT Education - 07/09/20 0847    Education Details updated HEP    Person(s) Educated Patient    Methods Explanation;Handout    Comprehension Verbalized understanding            PT Short Term Goals - 06/18/20 0855      PT SHORT TERM GOAL #1   Title Pt will demo consistency and independence with his initial HEP to improve flexibility and understanding of proper desk ergonomics.    Status Achieved             PT Long Term Goals - 05/28/20 1208      PT LONG TERM GOAL #1   Title Pt will have improved hamstring flexibility, lacking no more than 30 deg of passive knee extension with 90/90 testing.    Time 6    Period Weeks    Status New    Target Date 07/09/20      PT LONG TERM GOAL #2   Title Pt will have improved understanding of position changes during the day, evident by purchasing a standing desk or atleast taking 10 minute break every 1 hour of work.    Time 6    Period Weeks    Status New      PT LONG TERM GOAL #3   Title Pt will report atleast 60% improvement in LE symptoms and low back pain throughout a work day.    Time 6    Period Weeks    Status New                 Plan - 07/09/20 0839    Clinical Impression Statement Pt was discharged this visit having met all of his short and long term goals. He denies pain throughout the day, other than intermittent back soreness with prolonged siting. He is in the process of attaining a standing desk for home. His neural tension has decreased bilaterally, and he is independent with his HEP. PT made further updates to his current HEP and he demonstrated good understanding of this. He is pleased with his progress and agreeable with discharge.    Rehab Potential Good    PT Frequency 1x / week    PT Duration 6 weeks    PT Treatment/Interventions ADLs/Self Care Home Management;Electrical Stimulation;Moist Heat;Functional mobility training;Therapeutic activities;Therapeutic  exercise;Dry needling;Neuromuscular re-education;Patient/family education      PT Next Visit Plan d/c    PT Home Exercise Plan [id number]    Consulted and Agree with Plan of Care Patient           Patient will benefit from skilled therapeutic intervention in order to improve the following deficits and impairments:  Decreased activity tolerance, Impaired flexibility, Obesity, Decreased range of motion, Postural dysfunction, Pain, Improper body mechanics  Visit Diagnosis: Abnormal posture  Acute left-sided low back pain with left-sided sciatica     Problem List Patient Active Problem List   Diagnosis Date Noted  . Prediabetes 07/11/2019  . Class 3 severe obesity with serious comorbidity and body mass index (BMI) of 50.0 to 59.9 in adult ([place]) 07/11/2019  . Vitamin D deficiency 07/11/2019  . ALLERGIC RHINITIS 06/13/2010  . SNORING 06/13/2010  . HYPERGLYCEMIA 06/03/2010  . HYPERTENSION 05/28/2010  . FATIGUE 05/28/2010  . CHEST WALL PAIN, ANTERIOR 06/20/2009  . TRANSAMINASES, SERUM, ELEVATED 10/15/2008  . MORBID OBESITY 08/17/2008   PHYSICAL THERAPY DISCHARGE SUMMARY  Visits from Start of Care: 5  Current functional level related to goals / functional outcomes: See above for more details    Remaining deficits: See above for more details    Education / Equipment: See above for more details   Plan: Patient agrees to discharge.  Patient goals were met. Patient is being discharged due to meeting the stated rehab goals.  ?????         8:49 AM,07/09/20 Sherol Dade PT, [facility]  [facility] [address], [place], [place], [address] Phone: [phone]   Fax:  [phone]  Name: Tristan Perez MRN: [id number] Date of Birth: 11/16/1970

## 2020-07-10 ENCOUNTER — Encounter: Payer: No Typology Code available for payment source | Admitting: Physical Therapy

## 2020-10-12 ENCOUNTER — Encounter (HOSPITAL_COMMUNITY): Payer: Self-pay | Admitting: Emergency Medicine

## 2020-10-12 ENCOUNTER — Other Ambulatory Visit: Payer: Self-pay

## 2020-10-12 ENCOUNTER — Observation Stay (HOSPITAL_COMMUNITY)
Admission: EM | Admit: 2020-10-12 | Discharge: 2020-10-14 | Disposition: A | Discharge status: Home / Self Care | Payer: No Typology Code available for payment source | Attending: Internal Medicine | Admitting: Internal Medicine

## 2020-10-12 ENCOUNTER — Emergency Department (HOSPITAL_COMMUNITY): Payer: No Typology Code available for payment source

## 2020-10-12 DIAGNOSIS — I10 Essential (primary) hypertension: Secondary | ICD-10-CM | POA: Insufficient documentation

## 2020-10-12 DIAGNOSIS — Z20822 Contact with and (suspected) exposure to covid-19: Secondary | ICD-10-CM | POA: Diagnosis not present

## 2020-10-12 DIAGNOSIS — R739 Hyperglycemia, unspecified: Secondary | ICD-10-CM | POA: Insufficient documentation

## 2020-10-12 DIAGNOSIS — R42 Dizziness and giddiness: Secondary | ICD-10-CM | POA: Diagnosis present

## 2020-10-12 DIAGNOSIS — Z79899 Other long term (current) drug therapy: Secondary | ICD-10-CM | POA: Insufficient documentation

## 2020-10-12 DIAGNOSIS — I498 Other specified cardiac arrhythmias: Principal | ICD-10-CM | POA: Insufficient documentation

## 2020-10-12 DIAGNOSIS — G90A Postural orthostatic tachycardia syndrome (POTS): Secondary | ICD-10-CM | POA: Diagnosis present

## 2020-10-12 DIAGNOSIS — R000 Tachycardia, unspecified: Secondary | ICD-10-CM

## 2020-10-12 DIAGNOSIS — R7309 Other abnormal glucose: Secondary | ICD-10-CM | POA: Diagnosis present

## 2020-10-12 LAB — BASIC METABOLIC PANEL
Anion gap: 13 (ref 5–15)
BUN: 14 mg/dL (ref 6–20)
CO2: 21 mmol/L — ABNORMAL LOW (ref 22–32)
Calcium: 9.2 mg/dL (ref 8.9–10.3)
Chloride: 104 mmol/L (ref 98–111)
Creatinine, Ser: 0.85 mg/dL (ref 0.61–1.24)
GFR, Estimated: >60 mL/min (ref >60)
Glucose, Bld: 87 mg/dL (ref 70–99)
Potassium: 4.6 mmol/L (ref 3.5–5.1)
Sodium: 138 mmol/L (ref 135–145)

## 2020-10-12 LAB — CBC
HCT: 47.4 % (ref 39.0–52.0)
Hemoglobin: 15.3 g/dL (ref 13.0–17.0)
MCH: 27.8 pg (ref 26.0–34.0)
MCHC: 32.3 g/dL (ref 30.0–36.0)
MCV: 86 fL (ref 80.0–100.0)
Platelets: 330 10*3/uL (ref 150–400)
RBC: 5.51 MIL/uL (ref 4.22–5.81)
RDW: 13.8 % (ref 11.5–15.5)
WBC: 10.9 10*3/uL — ABNORMAL HIGH (ref 4.0–10.5)
nRBC: 0 % (ref 0.0–0.2)

## 2020-10-12 LAB — CBG MONITORING, ED: Glucose-Capillary: 135 mg/dL — ABNORMAL HIGH (ref 70–99)

## 2020-10-12 LAB — TSH: TSH: 2.832 u[IU]/mL (ref 0.350–4.500)

## 2020-10-12 LAB — TROPONIN I (HIGH SENSITIVITY): Troponin I (High Sensitivity): 6 ng/L (ref <18)

## 2020-10-12 MED ORDER — IOHEXOL 350 MG/ML SOLN
100.0000 mL | Freq: Once | INTRAVENOUS | Status: AC | PRN
  Administered 2020-10-12: 100 mL via INTRAVENOUS

## 2020-10-12 MED ORDER — SODIUM CHLORIDE 0.9 % IV SOLN: INTRAVENOUS | Status: DC

## 2020-10-12 NOTE — ED Provider Notes (Signed)
[facility]EM[facility]GENCY DEPT Provider Note   CSN: [id number] Arrival date & time: 10/12/20  1711     History Chief Complaint  Patient presents with   Tachycardia    Tristan Perez is a 49 y.o. male.  49 year old male presents with dizziness that is worse with standing.  Seen by his doctor for similar symptoms and has been diagnosed with vertigo.  States he is scheduled see a specialist for this.  Today when he stood up he became lightheaded and felt the room spinning.  Felt like he needed to close his eyes.  Denies any chest pain or shortness of breath.  Denies any history of volume loss.  Patient denies taking any medications for this.  No prior history of same.        Past Medical History:  Diagnosis Date   ADHD    Constipation    AND ALTERNATES WITH DIARRHEA AND SOME BLOOD SINCE END OF JUNE 2020   Eczema    Fatty liver    High blood pressure    History of kidney stones    History of urinary tract infection    Hypertension    Pt states was on beta blocker 10 years ago but took only for 6 months    Kidney stones    Obesity    Palpitations    Prediabetes     Patient Active Problem List   Diagnosis Date Noted   Prediabetes 07/11/2019   Class 3 severe obesity with serious comorbidity and body mass index (BMI) of 50.0 to 59.9 in adult ([facility]) 07/11/2019   Vitamin D deficiency 07/11/2019   ALLERGIC RHINITIS 06/13/2010   SNORING 06/13/2010   HYPERGLYCEMIA 06/03/2010   HYPERTENSION 05/28/2010   FATIGUE 05/28/2010   CHEST WALL PAIN, ANTERIOR 06/20/2009   TRANSAMINASES, SERUM, ELEVATED 10/15/2008   MORBID OBESITY 08/17/2008    Past Surgical History:  Procedure Laterality Date   COLONOSCOPY WITH PROPOFOL N/A 06/22/2019   Procedure: COLONOSCOPY WITH PROPOFOL;  Surgeon: Carman Ching, MD;  Location: [facility];  Service: Endoscopy;  Laterality: N/A;   CYSTOSCOPY/URETEROSCOPY/HOLMIUM LASER/STENT PLACEMENT Left 09/14/2016    Procedure: CYSTOSCOPY/RETROGRADES/URETEROSCOPY/HOLMIUM LASER/STENT PLACEMENT;  Surgeon: Heloise Purpura, MD;  Location: [facility];  Service: Urology;  Laterality: Left;   extraction of wisdom teeth         Family History  Problem Relation Age of Onset   Diabetes Father    Hypertension Father    Obesity Father    Stroke Other    Hyperlipidemia Mother     Social History   Tobacco Use   Smoking status: Never Smoker   Smokeless tobacco: Never Used  Vaping Use   Vaping Use: Never used  Substance Use Topics   Alcohol use: No   Drug use: No    Home Medications Prior to Admission medications   Medication Sig Start Date End Date Taking? Authorizing Provider  ibuprofen (ADVIL) 200 MG tablet Take 400 mg by mouth every 6 (six) hours as needed for headache or moderate pain.    [provider]  meloxicam (MOBIC) 7.5 MG tablet Take 1 tablet (7.5 mg total) by mouth daily. 08/27/19   Henderly, Britni A, PA-C  polyethylene glycol (MIRALAX / GLYCOLAX) 17 g packet Take 17 g by mouth every evening.    [provider]  Vitamin D, Ergocalciferol, (DRISDOL) 1.25 MG (50000 UT) CAPS capsule Take 1 capsule (50,000 Units total) by mouth every 7 (seven) days. 08/29/19   Roswell Nickel, DO  Allergies    Patient has no known allergies.  Review of Systems   Review of Systems  All other systems reviewed and are negative.   Physical Exam Updated Vital Signs BP (!) 166/86    Pulse (!) 109    Temp 98.7 F (37.1 C) (Oral)    Resp 18    Ht 1.829 m (6')    Wt (!) 192.3 kg    SpO2 95%    BMI 57.50 kg/m   Physical Exam Vitals and nursing note reviewed.  Constitutional:      General: He is not in acute distress.    Appearance: Normal appearance. He is well-developed and well-nourished. He is not toxic-appearing.  HENT:     Head: Normocephalic and atraumatic.  Eyes:     General: Lids are normal.     Extraocular Movements: EOM normal.     Conjunctiva/sclera: Conjunctivae  normal.     Pupils: Pupils are equal, round, and reactive to light.  Neck:     Thyroid: No thyroid mass.     Trachea: No tracheal deviation.  Cardiovascular:     Rate and Rhythm: Regular rhythm. Tachycardia present.     Heart sounds: Normal heart sounds. No murmur heard. No gallop.   Pulmonary:     Effort: Pulmonary effort is normal. No respiratory distress.     Breath sounds: Normal breath sounds. No stridor. No decreased breath sounds, wheezing, rhonchi or rales.  Abdominal:     General: Bowel sounds are normal. There is no distension.     Palpations: Abdomen is soft.     Tenderness: There is no abdominal tenderness. There is no CVA tenderness or rebound.  Musculoskeletal:        General: No tenderness or edema. Normal range of motion.     Cervical back: Normal range of motion and neck supple.  Skin:    General: Skin is warm and dry.     Findings: No abrasion or rash.  Neurological:     Mental Status: He is alert and oriented to person, place, and time.     GCS: GCS eye subscore is 4. GCS verbal subscore is 5. GCS motor subscore is 6.     Cranial Nerves: No cranial nerve deficit.     Sensory: No sensory deficit.     Deep Tendon Reflexes: Strength normal.  Psychiatric:        Mood and Affect: Mood and affect normal.        Speech: Speech normal.        Behavior: Behavior normal.     ED Results / Procedures / Treatments   Labs (all labs ordered are listed, but only abnormal results are displayed) Labs Reviewed  BASIC METABOLIC PANEL - Abnormal; Notable for the following components:      Result Value   CO2 21 (*)    All other components within normal limits  CBC - Abnormal; Notable for the following components:   WBC 10.9 (*)    All other components within normal limits  CBG MONITORING, ED - Abnormal; Notable for the following components:   Glucose-Capillary 135 (*)    All other components within normal limits  CBG MONITORING, ED  TROPONIN I (HIGH SENSITIVITY)     EKG None  ED ECG REPORT   Date: 10/12/2020  Rate: 120  Rhythm: sinus tachycardia  QRS Axis: normal  Intervals: normal  ST/T Wave abnormalities: normal  Conduction Disutrbances:none  Narrative Interpretation:   Old EKG Reviewed: none available  I have personally reviewed the EKG tracing and agree with the computerized printout as noted. Radiology No results found.  Procedures Procedures (including critical care time)  Medications Ordered in ED Medications  0.9 %  sodium chloride infusion (has no administration in time range)    ED Course  I have reviewed the triage vital signs and the nursing notes.  Pertinent labs & imaging results that were available during my care of the patient were reviewed by me and considered in my medical decision making (see chart for details).    MDM Rules/Calculators/A&P                          Patient's heart rate went up to 145 when he stood up.  Became lightheaded and dizzy.  CT of the chest negative for PE.  TSH normal.  Unclear etiology as to his sinus tachycardia.  Will admit to the hospital service Final Clinical Impression(s) / ED Diagnoses Final diagnoses:  None    Rx / DC Orders ED Discharge Orders    None       Lorre Nick, MD 10/12/20 2243

## 2020-10-12 NOTE — H&P (Signed)
History and Physical   TREBOR GALDAMEZ [facility]:[id number] DOB: Nov 03, 1970 DOA: 10/12/2020  Referring MD/NP/PA: Dr. Freida Busman  PCP: Farris Has, MD   Outpatient Specialists: None  Patient coming from: Home  Chief Complaint: Palpitation  HPI: Tristan Perez is a 49 y.o. male with medical history significant of hypertension, ADHD, morbid obesity, obstructive sleep apnea and prediabetes who presented with persistent tachycardia when standing. Patient has prediabetes. He is noted this in the past where he has some dizziness diagnosed as vertigo. Patient has noticed the room spinning around him when he has these episodes. He closes his eyes sometimes some feels better. His blood pressure has been up and down but not on any formal medications at the moment. He denied any nausea vomiting or diarrhea. Denied any fever or chills. Patient was seen in the ER appears to have postural tachycardia with heart rate rising when he gets up and stands. Is suspected to have postural orthostatic tachycardia and being admitted to the hospital for evaluation. Does not appear to have PE on CT angiogram..  ED Course: Temperature 98.7 blood pressure 166/86 pulse 112 respiratory 26 oxygen sats 95% on room air. White count is 10.9 hemoglobin 15.3 platelet 330. CO2 21 otherwise rest of the chemistries within normal. CT angiogram of the chest showed no PE. Acute viral screen is negative. TSH 2.832  Review of Systems: As per HPI otherwise 10 point review of systems negative.    Past Medical History:  Diagnosis Date  . ADHD   . Constipation    AND ALTERNATES WITH DIARRHEA AND SOME BLOOD SINCE END OF JUNE 2020  . Eczema   . Fatty liver   . High blood pressure   . History of kidney stones   . History of urinary tract infection   . Hypertension    Pt states was on beta blocker 10 years ago but took only for 6 months   . Kidney stones   . Obesity   . Palpitations   . Prediabetes     Past Surgical History:   Procedure Laterality Date  . COLONOSCOPY WITH PROPOFOL N/A 06/22/2019   Procedure: COLONOSCOPY WITH PROPOFOL;  Surgeon: Carman Ching, MD;  Location: [facility];  Service: Endoscopy;  Laterality: N/A;  . CYSTOSCOPY/URETEROSCOPY/HOLMIUM LASER/STENT PLACEMENT Left 09/14/2016   Procedure: CYSTOSCOPY/RETROGRADES/URETEROSCOPY/HOLMIUM LASER/STENT PLACEMENT;  Surgeon: Heloise Purpura, MD;  Location: [facility];  Service: Urology;  Laterality: Left;  . extraction of wisdom teeth       reports that he has never smoked. He has never used smokeless tobacco. He reports that he does not drink alcohol and does not use drugs.  No Known Allergies  Family History  Problem Relation Age of Onset  . Diabetes Father   . Hypertension Father   . Obesity Father   . Stroke Other   . Hyperlipidemia Mother      Prior to Admission medications   Medication Sig Start Date End Date Taking? Authorizing Provider  ibuprofen (ADVIL) 200 MG tablet Take 400 mg by mouth every 6 (six) hours as needed for headache or moderate pain.    [provider]  meloxicam (MOBIC) 7.5 MG tablet Take 1 tablet (7.5 mg total) by mouth daily. 08/27/19   Henderly, Britni A, PA-C  polyethylene glycol (MIRALAX / GLYCOLAX) 17 g packet Take 17 g by mouth every evening.    [provider]  Vitamin D, Ergocalciferol, (DRISDOL) 1.25 MG (50000 UT) CAPS capsule Take 1 capsule (50,000 Units total) by mouth every 7 (seven)  days. 08/29/19   Roswell Nickel, DO    Physical Exam: Vitals:   10/12/20 1728 10/12/20 2047 10/12/20 2115 10/12/20 2230  BP: (!) 154/96 (!) 166/86 (!) 152/98 (!) 148/80  Pulse: (!) 108 (!) 109 (!) 112 (!) 110  Resp: 18 18 (!) 26 18  Temp: 98.7 F (37.1 C)     TempSrc: Oral     SpO2: 96% 95% 97% 96%  Weight:      Height:          Constitutional: Morbidly obese, no acute distress Vitals:   10/12/20 1728 10/12/20 2047 10/12/20 2115 10/12/20 2230  BP: (!) 154/96 (!) 166/86 (!) 152/98 (!) 148/80  Pulse:  (!) 108 (!) 109 (!) 112 (!) 110  Resp: 18 18 (!) 26 18  Temp: 98.7 F (37.1 C)     TempSrc: Oral     SpO2: 96% 95% 97% 96%  Weight:      Height:       Eyes: PERRL, lids and conjunctivae normal ENMT: Mucous membranes are dry. Posterior pharynx clear of any exudate or lesions.Normal dentition.  Neck: normal, supple, no masses, no thyromegaly Respiratory: clear to auscultation bilaterally, no wheezing, no crackles. Normal respiratory effort. No accessory muscle use.  Cardiovascular: Sinus tachycardia, no murmurs / rubs / gallops. No extremity edema. 2+ pedal pulses. No carotid bruits.  Abdomen: no tenderness, no masses palpated. No hepatosplenomegaly. Bowel sounds positive.  Musculoskeletal: no clubbing / cyanosis. No joint deformity upper and lower extremities. Good ROM, no contractures. Normal muscle tone.  Skin: no rashes, lesions, ulcers. No induration Neurologic: CN 2-12 grossly intact. Sensation intact, DTR normal. Strength 5/5 in all 4.  Psychiatric: Normal judgment and insight. Alert and oriented x 3. Normal mood.     Labs on Admission: I have personally reviewed following labs and imaging studies  CBC: Recent Labs  Lab 10/12/20 1918  WBC 10.9*  HGB 15.3  HCT 47.4  MCV 86.0  PLT 330   Basic Metabolic Panel: Recent Labs  Lab 10/12/20 1918  NA 138  K 4.6  CL 104  CO2 21*  GLUCOSE 87  BUN 14  CREATININE 0.85  CALCIUM 9.2   GFR: Estimated Creatinine Clearance: 187.7 mL/min (by C-G formula based on SCr of 0.85 mg/dL). Liver Function Tests: No results for input(s): AST, ALT, ALKPHOS, BILITOT, PROT, ALBUMIN in the last 168 hours. No results for input(s): LIPASE, AMYLASE in the last 168 hours. No results for input(s): AMMONIA in the last 168 hours. Coagulation Profile: No results for input(s): INR, PROTIME in the last 168 hours. Cardiac Enzymes: No results for input(s): CKTOTAL, CKMB, CKMBINDEX, TROPONINI in the last 168 hours. BNP (last 3 results) No results  for input(s): PROBNP in the last 8760 hours. HbA1C: No results for input(s): HGBA1C in the last 72 hours. CBG: Recent Labs  Lab 10/12/20 1724  GLUCAP 135*   Lipid Profile: No results for input(s): CHOL, HDL, LDLCALC, TRIG, CHOLHDL, LDLDIRECT in the last 72 hours. Thyroid Function Tests: Recent Labs    10/12/20 2123  TSH 2.832   Anemia Panel: No results for input(s): VITAMINB12, FOLATE, FERRITIN, TIBC, IRON, RETICCTPCT in the last 72 hours. Urine analysis:    Component Value Date/Time   COLORURINE YELLOW 09/09/2016 0200   APPEARANCEUR CLOUDY (A) 09/09/2016 0200   LABSPEC 1.026 09/09/2016 0200   PHURINE 6.0 09/09/2016 0200   GLUCOSEU NEGATIVE 09/09/2016 0200   HGBUR LARGE (A) 09/09/2016 0200   BILIRUBINUR SMALL (A) 09/09/2016 0200   Lavenia Atlas  15 (A) 09/09/2016 0200   PROTEINUR NEGATIVE 09/09/2016 0200   NITRITE NEGATIVE 09/09/2016 0200   LEUKOCYTESUR NEGATIVE 09/09/2016 0200   Sepsis Labs: @LABRCNTIP (procalcitonin:4,lacticidven:4) )No results found for this or any previous visit (from the past 240 hour(s)).   Radiological Exams on Admission: CT Angio Chest PE W/Cm &/Or Wo Cm  Result Date: 10/12/2020 CLINICAL DATA:  Worsening dizziness and palpitations. EXAM: CT ANGIOGRAPHY CHEST WITH CONTRAST TECHNIQUE: Multidetector CT imaging of the chest was performed using the standard protocol during bolus administration of intravenous contrast. Multiplanar CT image reconstructions and MIPs were obtained to evaluate the vascular anatomy. CONTRAST:  10/14/2020 OMNIPAQUE IOHEXOL 350 MG/ML SOLN COMPARISON:  None. FINDINGS: Cardiovascular: Satisfactory opacification of the pulmonary arteries to the segmental level. No evidence of pulmonary embolism. Normal heart size. No pericardial effusion. Mediastinum/Nodes: No enlarged mediastinal, hilar, or axillary lymph nodes. An 8 mm cystic appearing area is seen within the left lobe of the thyroid gland. The trachea and esophagus demonstrate no  significant findings. Lungs/Pleura: Lungs are clear. No pleural effusion or pneumothorax. Upper Abdomen: There is diffuse fatty infiltration of the liver parenchyma. Musculoskeletal: No chest wall abnormality. No acute or significant osseous findings. Review of the MIP images confirms the above findings. IMPRESSION: 1. No CT evidence of pulmonary embolism or other acute intrathoracic process. 2. Fatty liver. Electronically Signed   By: M.D.   On: 10/12/2020 22:20   DG Chest Port 1 View  Result Date: 10/12/2020 CLINICAL DATA:  Dizziness for several weeks EXAM: PORTABLE CHEST 1 VIEW COMPARISON:  Radiograph 08/27/2019 FINDINGS: Accounting for body habitus, the lungs are clear. Chronic elevation the right hemidiaphragm some adjacent atelectatic changes. No consolidation, features of edema, pneumothorax, or effusion. The cardiomediastinal contours are unremarkable. No acute osseous or soft tissue abnormality. Telemetry leads overlie the chest. IMPRESSION: Chronic elevation of the right hemidiaphragm with adjacent atelectatic changes. No acute cardiopulmonary disease. Electronically Signed   By: 13/10/2018 M.D.   On: 10/12/2020 21:23    EKG: Independently reviewed. Shows sinus tachycardia with a rate of 114. Normal intervals. No significant ST changes.  Assessment/Plan Principal Problem:   Postural orthostatic tachycardia syndrome Active Problems:   Essential hypertension   HYPERGLYCEMIA   Class 3 severe obesity with serious comorbidity and body mass index (BMI) of 50.0 to 59.9 in adult [facility])     #1 Postural orthostatic tachycardia syndrome: Suspected. We will get echocardiogram. Admit the patient for observation. Hydrate patient. May initiate beta-blockers both for blood pressure and heart rate control.  #2 essential hypertension: Continue blood pressure control.  #3 morbid obesity: Continue with dietary control.   DVT prophylaxis: Lovenox Code Status: Full code Family  Communication: Father at bedside Disposition Plan: Home Consults called: None Admission status: Observation  Severity of Illness: The appropriate patient status for this patient is OBSERVATION. Observation status is judged to be reasonable and necessary in order to provide the required intensity of service to ensure the patient's safety. The patient's presenting symptoms, physical exam findings, and initial radiographic and laboratory data in the context of their medical condition is felt to place them at decreased risk for further clinical deterioration. Furthermore, it is anticipated that the patient will be medically stable for discharge from the hospital within 2 midnights of admission. The following factors support the patient status of observation.   " The patient's presenting symptoms include palpitations and dizziness. " The physical exam findings include sinus tachycardia. " The initial radiographic and laboratory data are no significant  abnormalities.     Lonia BloodGARBA,LAWAL MD [facility] Pager [phone]  If 7PM-7AM, please contact night-coverage [email or web address] Password [email or web address]  10/12/2020, 11:48 PM

## 2020-10-12 NOTE — ED Triage Notes (Signed)
Pt states that he has had dizziness x several weeks and was referred to ENT but hasn't gotten in with them yet. PCP fears that it could be vertigo. States it happened again today but he now has palpitations associated. Alert and oriented.

## 2020-10-13 ENCOUNTER — Observation Stay (HOSPITAL_BASED_OUTPATIENT_CLINIC_OR_DEPARTMENT_OTHER): Payer: No Typology Code available for payment source

## 2020-10-13 DIAGNOSIS — I10 Essential (primary) hypertension: Secondary | ICD-10-CM

## 2020-10-13 DIAGNOSIS — R002 Palpitations: Secondary | ICD-10-CM

## 2020-10-13 DIAGNOSIS — I498 Other specified cardiac arrhythmias: Secondary | ICD-10-CM | POA: Diagnosis not present

## 2020-10-13 LAB — CBC
HCT: 44.4 % (ref 39.0–52.0)
Hemoglobin: 14.3 g/dL (ref 13.0–17.0)
MCH: 27.8 pg (ref 26.0–34.0)
MCHC: 32.2 g/dL (ref 30.0–36.0)
MCV: 86.2 fL (ref 80.0–100.0)
Platelets: 321 10*3/uL (ref 150–400)
RBC: 5.15 MIL/uL (ref 4.22–5.81)
RDW: 14 % (ref 11.5–15.5)
WBC: 10.6 10*3/uL — ABNORMAL HIGH (ref 4.0–10.5)
nRBC: 0 % (ref 0.0–0.2)

## 2020-10-13 LAB — ECHOCARDIOGRAM COMPLETE
Area-P 1/2: 2.37 cm2
Height: 72 in
S' Lateral: 2.6 cm
Weight: 6772.53 oz

## 2020-10-13 LAB — RESP PANEL BY RT-PCR (FLU A&B, COVID) ARPGX2
Influenza A by PCR: NEGATIVE
Influenza B by PCR: NEGATIVE
SARS Coronavirus 2 by RT PCR: NEGATIVE

## 2020-10-13 LAB — COMPREHENSIVE METABOLIC PANEL
ALT: 33 U/L (ref 0–44)
AST: 20 U/L (ref 15–41)
Albumin: 3.9 g/dL (ref 3.5–5.0)
Alkaline Phosphatase: 71 U/L (ref 38–126)
Anion gap: 12 (ref 5–15)
BUN: 13 mg/dL (ref 6–20)
CO2: 23 mmol/L (ref 22–32)
Calcium: 8.9 mg/dL (ref 8.9–10.3)
Chloride: 102 mmol/L (ref 98–111)
Creatinine, Ser: 0.91 mg/dL (ref 0.61–1.24)
GFR, Estimated: >60 mL/min (ref >60)
Glucose, Bld: 101 mg/dL — ABNORMAL HIGH (ref 70–99)
Potassium: 3.6 mmol/L (ref 3.5–5.1)
Sodium: 137 mmol/L (ref 135–145)
Total Bilirubin: 0.8 mg/dL (ref 0.3–1.2)
Total Protein: 7.2 g/dL (ref 6.5–8.1)

## 2020-10-13 LAB — HIV ANTIBODY (ROUTINE TESTING W REFLEX): HIV Screen 4th Generation wRfx: NONREACTIVE

## 2020-10-13 MED ORDER — MECLIZINE HCL 25 MG PO TABS
25.0000 mg | ORAL_TABLET | Freq: Three times a day (TID) | ORAL | Status: DC
  Administered 2020-10-13 – 2020-10-14 (×3): 25 mg via ORAL
  Filled 2020-10-13 (×3): qty 1

## 2020-10-13 MED ORDER — ACETAMINOPHEN 650 MG RE SUPP: 650.0000 mg | Freq: Four times a day (QID) | RECTAL | Status: DC | PRN

## 2020-10-13 MED ORDER — AMLODIPINE BESYLATE 5 MG PO TABS: 5.0000 mg | ORAL_TABLET | Freq: Every day | ORAL | Status: DC

## 2020-10-13 MED ORDER — ONDANSETRON HCL 4 MG/2ML IJ SOLN: 4.0000 mg | Freq: Four times a day (QID) | INTRAMUSCULAR | Status: DC | PRN

## 2020-10-13 MED ORDER — ONDANSETRON HCL 4 MG PO TABS: 4.0000 mg | ORAL_TABLET | Freq: Four times a day (QID) | ORAL | Status: DC | PRN

## 2020-10-13 MED ORDER — SODIUM CHLORIDE 0.9 % IV SOLN: INTRAVENOUS | Status: DC

## 2020-10-13 MED ORDER — ENOXAPARIN SODIUM 100 MG/ML ~~LOC~~ SOLN
95.0000 mg | SUBCUTANEOUS | Status: DC
  Administered 2020-10-13 – 2020-10-14 (×2): 95 mg via SUBCUTANEOUS
  Filled 2020-10-13 (×2): qty 1

## 2020-10-13 MED ORDER — ACETAMINOPHEN 325 MG PO TABS: 650.0000 mg | ORAL_TABLET | Freq: Four times a day (QID) | ORAL | Status: DC | PRN

## 2020-10-13 MED ORDER — AMLODIPINE BESYLATE 5 MG PO TABS
2.5000 mg | ORAL_TABLET | Freq: Every day | ORAL | Status: DC
  Administered 2020-10-13 – 2020-10-14 (×2): 2.5 mg via ORAL
  Filled 2020-10-13 (×2): qty 1

## 2020-10-13 NOTE — Progress Notes (Addendum)
PROGRESS NOTE  Tristan Perez WUJ:[id number] DOB: Oct 03, 1971 DOA: 10/12/2020 PCP: Farris Has, MD  HPI/Recap of past 24 hours: Tristan Perez is a 49 y.o. male with medical history significant of hypertension, ADHD, morbid obesity, obstructive sleep apnea and prediabetes who presented with persistent tachycardia when standing. Patient has prediabetes. He is noted this in the past where he has some dizziness diagnosed as vertigo. Patient has noticed the room spinning around him when he has these episodes. He closes his eyes sometimes some feels better. His blood pressure has been up and down but not on any formal medications at the moment. He denied any nausea vomiting or diarrhea. Denied any fever or chills. Patient was seen in the ER appears to have postural tachycardia with heart rate rising when he gets up and stands. Is suspected to have postural orthostatic tachycardia and being admitted to the hospital for evaluation. Does not appear to have PE on CT angiogram..  ED Course: Temperature 98.7 blood pressure 166/86 pulse 112 respiratory 26 oxygen sats 95% on room air. White count is 10.9 hemoglobin 15.3 platelet 330. CO2 21 otherwise rest of the chemistries within normal. CT angiogram of the chest showed no PE. Acute viral screen is negative. TSH 2.832  10/13/20: Reports palpitations with exertion.  No chest pain.  Assessment/Plan: Principal Problem:   Postural orthostatic tachycardia syndrome Active Problems:   Essential hypertension   HYPERGLYCEMIA   Class 3 severe obesity with serious comorbidity and body mass index (BMI) of 50.0 to 59.9 in adult [facility])  Suspected orthostatic tachycardia syndrome TSH normal 2.8. Positive orthostatic vital signs. 2D echo showed normal LVEF 60 to 65%, circumferential pericardial effusion.  No evidence of cardiac tamponade. Continue IV fluid Repeat orthostatic vital signs.  Circumferential pericardial effusion on 2D echo 10/13/2020 No  evidence of cardiac tamponade Cardiology consulted Dr. Micki Riley, cardiology fellow.  Vertigo, unspecified Worse when he turns his head Last episode was >5 years ago Possibly BPPV OT for [employer] pike maneuver But also reports ringing in his ears, Meniere disease? Start Meclizine TID x 3 days and cut down salt, alcohol, nicotine, and monosodium glutamate Completely avoid caffeine Will need to follow up with neurology outpatient Fall precautions  Severe morbid obesity BMI 57 Recommend bariatric clinic follow-up outpatient for weight loss.  Essential hypertension BP has been persistently elevated Start Norvasc 5 mg daily Continue to monitor vital signs.  Code Status: Full code  Family Communication: None at bedside.  Disposition Plan: Likely will DC tomorrow 10/14/2020.   Consultants:  Cardiology  Procedures:  2D echo  Antimicrobials:  None  DVT prophylaxis: Subcu Lovenox daily  Status is: Observation    Dispo:  Patient From: Home  Planned Disposition: Home  Expected discharge date: 10/13/2020  Medically stable for discharge: No, ongoing management of orthostatic tachycardia syndrome.   Objective: Vitals:   10/13/20 0055 10/13/20 0117 10/13/20 0449 10/13/20 1146  BP: 138/89 (!) 144/92 (!) 122/51 (!) 156/73  Pulse: 99 (!) 103 80 82  Resp: Temp:  99.5 F (37.5 C) 99.1 F (37.3 C) 98 F (36.7 C)  TempSrc:  Oral Oral Oral  SpO2: 96% 97% 95% 98%  Weight:  (!) 192 kg    Height:  6' (1.829 m)      Intake/Output Summary (Last 24 hours) at 10/13/2020 1652 Last data filed at 10/13/2020 0600 Gross per 24 hour  Intake 443.93 ml  Output --  Net 443.93 ml   [employer]  10/12/20 1719 10/13/20 0117  Weight: (!) 192.3 kg (!) 192 kg    Exam:  . General: 49 y.o. year-old male super morbidly obese in no acute distress.  Alert and oriented x3. . Cardiovascular: Regular rate and rhythm with no rubs or gallops.  No thyromegaly or JVD  noted.   Marland Kitchen Respiratory: Clear to auscultation with no wheezes or rales. Good inspiratory effort. . Abdomen: Soft nontender nondistended with normal bowel sounds x4 quadrants. . Musculoskeletal: No lower extremity edema bilaterally. Marland Kitchen Psychiatry: Mood is appropriate for condition and setting   Data Reviewed: CBC: Recent Labs  Lab 10/12/20 1918 10/13/20 0423  WBC 10.9* 10.6*  HGB 15.3 14.3  HCT 47.4 44.4  MCV 86.0 86.2  PLT 330 321   Basic Metabolic Panel: Recent Labs  Lab 10/12/20 1918 10/13/20 0423  NA 138 137  K 4.6 3.6  CL 104 102  CO2 21* 23  GLUCOSE 87 101*  BUN 14 13  CREATININE 0.85 0.91  CALCIUM 9.2 8.9   GFR: Estimated Creatinine Clearance: 175.2 mL/min (by C-G formula based on SCr of 0.91 mg/dL). Liver Function Tests: Recent Labs  Lab 10/13/20 0423  AST 20  ALT 33  ALKPHOS 71  BILITOT 0.8  PROT 7.2  ALBUMIN 3.9   No results for input(s): LIPASE, AMYLASE in the last 168 hours. No results for input(s): AMMONIA in the last 168 hours. Coagulation Profile: No results for input(s): INR, PROTIME in the last 168 hours. Cardiac Enzymes: No results for input(s): CKTOTAL, CKMB, CKMBINDEX, TROPONINI in the last 168 hours. BNP (last 3 results) No results for input(s): PROBNP in the last 8760 hours. HbA1C: No results for input(s): HGBA1C in the last 72 hours. CBG: Recent Labs  Lab 10/12/20 1724  GLUCAP 135*   Lipid Profile: No results for input(s): CHOL, HDL, LDLCALC, TRIG, CHOLHDL, LDLDIRECT in the last 72 hours. Thyroid Function Tests: Recent Labs    10/12/20 2123  TSH 2.832   Anemia Panel: No results for input(s): VITAMINB12, FOLATE, FERRITIN, TIBC, IRON, RETICCTPCT in the last 72 hours. Urine analysis:    Component Value Date/Time   COLORURINE YELLOW 09/09/2016 0200   APPEARANCEUR CLOUDY (A) 09/09/2016 0200   LABSPEC 1.026 09/09/2016 0200   PHURINE 6.0 09/09/2016 0200   GLUCOSEU NEGATIVE 09/09/2016 0200   HGBUR LARGE (A) 09/09/2016 0200    BILIRUBINUR SMALL (A) 09/09/2016 0200   KETONESUR 15 (A) 09/09/2016 0200   PROTEINUR NEGATIVE 09/09/2016 0200   NITRITE NEGATIVE 09/09/2016 0200   LEUKOCYTESUR NEGATIVE 09/09/2016 0200   Sepsis Labs: @LABRCNTIP (procalcitonin:4,lacticidven:4)  ) Recent Results (from the past 240 hour(s))  Resp Panel by RT-PCR (Flu A&B, Covid) Nasopharyngeal Swab     Status: None   Collection Time: 10/12/20 11:07 PM   Specimen: Nasopharyngeal Swab; Nasopharyngeal(NP) swabs in vial transport medium  Result Value Ref Range Status   SARS Coronavirus 2 by RT PCR NEGATIVE NEGATIVE Final    Comment: (NOTE) SARS-CoV-2 target nucleic acids are NOT DETECTED.  The SARS-CoV-2 RNA is generally detectable in upper respiratory specimens during the acute phase of infection. The lowest concentration of SARS-CoV-2 viral copies this assay can detect is 138 copies/mL. A negative result does not preclude SARS-Cov-2 infection and should not be used as the sole basis for treatment or other patient management decisions. A negative result may occur with  improper specimen collection/handling, submission of specimen other than nasopharyngeal swab, presence of viral mutation(s) within the areas targeted by this assay, and inadequate number of viral copies(<138 copies/mL).  A negative result must be combined with clinical observations, patient history, and epidemiological information. The expected result is Negative.  Fact Sheet for Patients:  BloggerCourse.com  Fact Sheet for Healthcare Providers:  SeriousBroker.it  This test is no t yet approved or cleared by the [place] FDA and  has been authorized for detection and/or diagnosis of SARS-CoV-2 by FDA under an Emergency Use Authorization (EUA). This EUA will remain  in effect (meaning this test can be used) for the duration of the COVID-19 declaration under Section 564(b)(1) of the Act, 21 U.S.C.section  360bbb-3(b)(1), unless the authorization is terminated  or revoked sooner.       Influenza A by PCR NEGATIVE NEGATIVE Final   Influenza B by PCR NEGATIVE NEGATIVE Final    Comment: (NOTE) The Xpert Xpress SARS-CoV-2/FLU/RSV plus assay is intended as an aid in the diagnosis of influenza from Nasopharyngeal swab specimens and should not be used as a sole basis for treatment. Nasal washings and aspirates are unacceptable for Xpert Xpress SARS-CoV-2/FLU/RSV testing.  Fact Sheet for Patients: BloggerCourse.com  Fact Sheet for Healthcare Providers: SeriousBroker.it  This test is not yet approved or cleared by the [place] FDA and has been authorized for detection and/or diagnosis of SARS-CoV-2 by FDA under an Emergency Use Authorization (EUA). This EUA will remain in effect (meaning this test can be used) for the duration of the COVID-19 declaration under Section 564(b)(1) of the Act, 21 U.S.C. section 360bbb-3(b)(1), unless the authorization is terminated or revoked.  Performed at [facility], [address] [place], [place] [address]       Studies: CT Angio Chest PE W/Cm &/Or Wo Cm  Result Date: 10/12/2020 CLINICAL DATA:  Worsening dizziness and palpitations. EXAM: CT ANGIOGRAPHY CHEST WITH CONTRAST TECHNIQUE: Multidetector CT imaging of the chest was performed using the standard protocol during bolus administration of intravenous contrast. Multiplanar CT image reconstructions and MIPs were obtained to evaluate the vascular anatomy. CONTRAST:  OMNIPAQUE IOHEXOL 350 MG/ML SOLN COMPARISON:  None. FINDINGS: Cardiovascular: Satisfactory opacification of the pulmonary arteries to the segmental level. No evidence of pulmonary embolism. Normal heart size. No pericardial effusion. Mediastinum/Nodes: No enlarged mediastinal, hilar, or axillary lymph nodes. An 8 mm cystic appearing area is seen within the left  lobe of the thyroid gland. The trachea and esophagus demonstrate no significant findings. Lungs/Pleura: Lungs are clear. No pleural effusion or pneumothorax. Upper Abdomen: There is diffuse fatty infiltration of the liver parenchyma. Musculoskeletal: No chest wall abnormality. No acute or significant osseous findings. Review of the MIP images confirms the above findings. IMPRESSION: 1. No CT evidence of pulmonary embolism or other acute intrathoracic process. 2. Fatty liver. Electronically Signed   By: Aram Candela M.D.   On: 10/12/2020 22:20   DG Chest Port 1 View  Result Date: 10/12/2020 CLINICAL DATA:  Dizziness for several weeks EXAM: PORTABLE CHEST 1 VIEW COMPARISON:  Radiograph 08/27/2019 FINDINGS: Accounting for body habitus, the lungs are clear. Chronic elevation the right hemidiaphragm some adjacent atelectatic changes. No consolidation, features of edema, pneumothorax, or effusion. The cardiomediastinal contours are unremarkable. No acute osseous or soft tissue abnormality. Telemetry leads overlie the chest. IMPRESSION: Chronic elevation of the right hemidiaphragm with adjacent atelectatic changes. No acute cardiopulmonary disease. Electronically Signed   By: Kreg Shropshire M.D.   On: 10/12/2020 21:23   ECHOCARDIOGRAM COMPLETE  Result Date: 10/13/2020    ECHOCARDIOGRAM REPORT   Patient Name:   ADAMS HINCH Date of Exam: 10/13/2020 Medical Rec #:  [id number]         Height:       72.0 in Accession #:    [id number]        Weight:       423.3 lb Date of Birth:  [birth year]         BSA:          2.930 m Patient Age:    49 years          BP:           122/51 mmHg Patient Gender: M                 HR:           76 bpm. Exam Location:  Inpatient Procedure: 2D Echo, Color Doppler and Cardiac Doppler Indications:    R00.2 Palpitations  History:        Patient has no prior history of Echocardiogram examinations.                 Risk Factors:Hypertension and Sleep Apnea.  Sonographer:    Irving Burton Senior  RDCS Referring Phys: [id number] Rometta Emery  Sonographer Comments: Suboptimal apical and subcostal windows due to body habitus. IMPRESSIONS  1. Left ventricular ejection fraction, by estimation, is 60 to 65%. The left ventricle has normal function. The left ventricle has no regional wall motion abnormalities. Left ventricular diastolic parameters were normal.  2. Right ventricular systolic function is normal. The right ventricular size is normal.  3. The pericardial effusion is circumferential. There is no evidence of cardiac tamponade.  4. The mitral valve is grossly normal. No evidence of mitral valve regurgitation. No evidence of mitral stenosis.  5. The aortic valve is grossly normal. Aortic valve regurgitation is not visualized. No aortic stenosis is present. FINDINGS  Left Ventricle: Left ventricular ejection fraction, by estimation, is 60 to 65%. The left ventricle has normal function. The left ventricle has no regional wall motion abnormalities. The left ventricular internal cavity size was normal in size. There is  no left ventricular hypertrophy. Left ventricular diastolic parameters were normal. Right Ventricle: The right ventricular size is normal. No increase in right ventricular wall thickness. Right ventricular systolic function is normal. Left Atrium: Left atrial size was normal in size. Right Atrium: Right atrial size was normal in size. Pericardium: Trivial pericardial effusion is present. The pericardial effusion is circumferential. There is no evidence of cardiac tamponade. Presence of pericardial fat pad. Mitral Valve: The mitral valve is grossly normal. No evidence of mitral valve regurgitation. No evidence of mitral valve stenosis. Tricuspid Valve: The tricuspid valve is grossly normal. Tricuspid valve regurgitation is not demonstrated. No evidence of tricuspid stenosis. Aortic Valve: The aortic valve is grossly normal. Aortic valve regurgitation is not visualized. No aortic stenosis is  present. Pulmonic Valve: The pulmonic valve was grossly normal. Pulmonic valve regurgitation is not visualized. No evidence of pulmonic stenosis. Aorta: The aortic root and ascending aorta are structurally normal, with no evidence of dilitation. Venous: The inferior vena cava was not well visualized. IAS/Shunts: The atrial septum is grossly normal.  LEFT VENTRICLE PLAX 2D LVIDd:         5.00 cm  Diastology LVIDs:         2.60 cm  LV e' medial:    12.20 cm/s LV PW:         1.00 cm  LV E/e' medial:  5.9 LV IVS:        1.20 cm  LV e' lateral:   11.00 cm/s LVOT diam:     2.10 cm  LV E/e' lateral: 6.6 LV SV:         105 LV SV Index:   36 LVOT Area:     3.46 cm  RIGHT VENTRICLE RV S prime:     11.40 cm/s TAPSE (M-mode): 2.2 cm LEFT ATRIUM             Index       RIGHT ATRIUM           Index LA diam:        4.10 cm 1.40 cm/m  RA Area:     16.50 cm LA Vol (A2C):   59.9 ml 20.44 ml/m RA Volume:   42.00 ml  14.33 ml/m LA Vol (A4C):   73.3 ml 25.02 ml/m LA Biplane Vol: 67.2 ml 22.93 ml/m  AORTIC VALVE LVOT Vmax:   143.00 cm/s LVOT Vmean:  88.600 cm/s LVOT VTI:    0.302 m  AORTA Ao Root diam: 2.80 cm Ao Asc diam:  2.80 cm MITRAL VALVE MV Area (PHT): 2.37 cm    SHUNTS MV Decel Time: 320 msec    Systemic VTI:  0.30 m MV E velocity: 72.20 cm/s  Systemic Diam: 2.10 cm MV A velocity: 73.80 cm/s MV E/A ratio:  0.98 Lennie OdorWesley O'Neal MD Electronically signed by Lennie OdorWesley O'Neal MD Signature Date/Time: 10/13/2020/11:35:26 AM    Final     Scheduled Meds: . enoxaparin (LOVENOX) injection  95 mg Subcutaneous Q24H    Continuous Infusions: . sodium chloride 20 mL/hr at 10/12/20 2127  . sodium chloride 100 mL/hr at 10/13/20 1213     LOS: 0 days     Darlin Droparole N Ebonie Westerlund, MD [facility] Pager [phone]  If 7PM-7AM, please contact night-coverage [email or web address] Password [email or web address] 10/13/2020, 4:52 PM

## 2020-10-13 NOTE — Progress Notes (Signed)
Echocardiogram 2D Echocardiogram has been performed.  Tristan Perez 10/13/2020, 8:28 AM

## 2020-10-13 NOTE — ED Notes (Signed)
Report called to Lauren RN.

## 2020-10-14 DIAGNOSIS — I498 Other specified cardiac arrhythmias: Secondary | ICD-10-CM | POA: Diagnosis not present

## 2020-10-14 DIAGNOSIS — Z6841 Body Mass Index (BMI) 40.0 and over, adult: Secondary | ICD-10-CM

## 2020-10-14 DIAGNOSIS — I10 Essential (primary) hypertension: Secondary | ICD-10-CM

## 2020-10-14 MED ORDER — MECLIZINE HCL 25 MG PO TABS: 25.0000 mg | ORAL_TABLET | Freq: Three times a day (TID) | ORAL | 0 refills | Status: DC

## 2020-10-14 MED ORDER — METOPROLOL TARTRATE 50 MG PO TABS
50.0000 mg | ORAL_TABLET | Freq: Two times a day (BID) | ORAL | Status: DC
  Administered 2020-10-14: 13:00:00 50 mg via ORAL
  Filled 2020-10-14: qty 1

## 2020-10-14 MED ORDER — METOPROLOL TARTRATE 25 MG PO TABS: 25.0000 mg | ORAL_TABLET | Freq: Two times a day (BID) | ORAL | 11 refills | Status: AC

## 2020-10-14 MED ORDER — ONDANSETRON HCL 4 MG PO TABS: 4.0000 mg | ORAL_TABLET | Freq: Four times a day (QID) | ORAL | 0 refills | Status: DC | PRN

## 2020-10-14 MED ORDER — METOPROLOL TARTRATE 25 MG PO TABS: 25.0000 mg | ORAL_TABLET | Freq: Two times a day (BID) | ORAL | 11 refills | Status: DC

## 2020-10-14 MED ORDER — AMLODIPINE BESYLATE 2.5 MG PO TABS: 2.5000 mg | ORAL_TABLET | Freq: Every day | ORAL | 1 refills | Status: AC

## 2020-10-14 NOTE — Evaluation (Addendum)
Physical Therapy Evaluation Patient Details Name: Tristan Perez MRN: [id number] DOB: 10-20-1971 Today's Date: 10/14/2020   History of Present Illness  49 y.o. male with a hx of hypertension, ADHD, morbid obesity, OSA and prediabetes and admitted for Suspected orthostatic tachycardia syndrome  Clinical Impression  Pt admitted with above diagnosis.  Pt currently with functional limitations due to the deficits listed below (see PT Problem List). Pt will benefit from skilled PT to increase their independence and safety with mobility to allow discharge to the venue listed below.  Performed positional testing for BBPV and pt did have nystagmus with R Dix hallpike, so performed canalith repositioning procedure however pt also describes symptoms not consistent with BPPV such as sensation of rocking movement with mobility and tinnitus.  Pt encouraged to f/u with ENT and/or outpatient neuro PT for vestibular evaluation if symptoms persist.     Follow Up Recommendations Outpatient PT (outpatient neuro PT)    Equipment Recommendations  None recommended by PT    Recommendations for Other Services       Precautions / Restrictions Precautions Precautions: None      Mobility  Bed Mobility Overal bed mobility: Modified Independent                  Transfers Overall transfer level: Modified independent                  Ambulation/Gait Ambulation/Gait assistance: Supervision Gait Distance (Feet): 120 Feet Assistive device: None Gait Pattern/deviations: Wide base of support     General Gait Details: increased lateral trunk sway, reports feeling like he's on a boat, denies any issues with environment moving  Stairs            Wheelchair Mobility    Modified Rankin (Stroke Patients Only)       Balance                                             Pertinent Vitals/Pain Pain Assessment: No/denies pain    Home Living Family/patient expects  to be discharged to:: Private residence Living Arrangements: Children Available Help at Discharge: Family   Home Access: Stairs to enter     Home Layout: One level Home Equipment: None      Prior Function Level of Independence: Independent               Hand Dominance        Extremity/Trunk Assessment        Lower Extremity Assessment Lower Extremity Assessment: Overall WFL for tasks assessed       Communication   Communication: No difficulties  Cognition Arousal/Alertness: Awake/alert Behavior During Therapy: WFL for tasks assessed/performed Overall Cognitive Status: Within Functional Limits for tasks assessed                                        General Comments General comments (skin integrity, edema, etc.): Tristan Perez reports having tinnitus and pressure like feeling in bil ears for 3 months.  pt reports he has had positional vertigo before diagnosed by ENT.  Pt denies spinning sensation and reports feeling more of rocking side to side like being on a boat.  Pt with overshooting during saccades. Pt reports symptoms and observed downbeating nystagmus on right dix hallpike so  performed canalith repositioning procedure.  Pt with rocking symptoms with ambulating in hallway.  Pt with slow cautious gait however no unsteadiness or LOB.    Exercises     Assessment/Plan    PT Assessment Patient needs continued PT services  PT Problem List Decreased strength;Decreased activity tolerance;Decreased balance;Decreased knowledge of use of DME;Decreased mobility;Cardiopulmonary status limiting activity;Obesity       PT Treatment Interventions Therapeutic exercise;Gait training;DME instruction;Balance training;Functional mobility training;Therapeutic activities;Patient/family education;Stair training    PT Goals (Current goals can be found in the Care Plan section)  Acute Rehab PT Goals PT Goal Formulation: With patient Time For Goal Achievement:  10/21/20 Potential to Achieve Goals: Good    Frequency Min 3X/week   Barriers to discharge        Co-evaluation               AM-PAC PT "6 Clicks" Mobility  Outcome Measure Help needed turning from your back to your side while in a flat bed without using bedrails?: None Help needed moving from lying on your back to sitting on the side of a flat bed without using bedrails?: None Help needed moving to and from a bed to a chair (including a wheelchair)?: None Help needed standing up from a chair using your arms (e.g., wheelchair or bedside chair)?: None Help needed to walk in hospital room?: A Little Help needed climbing 3-5 steps with a railing? : A Little 6 Click Score: 22    End of Session   Activity Tolerance: Patient tolerated treatment well Patient left: in bed;with call bell/phone within reach Nurse Communication: Mobility status PT Visit Diagnosis: Other abnormalities of gait and mobility (R26.89)    Time: 6433-2951 PT Time Calculation (min) (ACUTE ONLY): 27 min   Charges:   PT Evaluation $PT Eval Moderate Complexity: 1 Mod PT Treatments $Canalith Rep Proc: 8-22 mins      Thomasene Mohair PT, DPT Acute Rehabilitation Services Pager: [phone] Office: [phone]  Maida Sale E 10/14/2020, 1:22 PM

## 2020-10-14 NOTE — Discharge Summary (Signed)
Physician Discharge Summary  Tristan Perez:[id number] DOB: 07-17-1971 DOA: 10/12/2020  PCP: Farris Has, MD  Admit date: 10/12/2020 Discharge date: 10/14/2020  Admitted From: Home Disposition:  Home Reco Homemmendations for Outpatient Follow-up:  1. Follow up with PCP in 1-2 weeks patient needs a sleep study 2. Please obtain BMP/CBC in one week   Home Health none Equipment/Devices none Discharge Condition: Stable CODE STATUS: Full code Diet recommendation: Cardiac Brief/Interim Summary:Tristan M Cameronis a 49 y.o.malewith medical history significant ofhypertension, ADHD, morbid obesity, obstructive sleep apnea and prediabetes who presented with persistent tachycardia when standing. Patient has prediabetes. He is noted this in the past where he has some dizziness diagnosed as vertigo. Patient has noticed the room spinning around him when he has these episodes. He closes his eyes sometimes some feels better. His blood pressure has been up and down but not on any formal medications at the moment. He denied any nausea vomiting or diarrhea. Denied any fever or chills. Patient was seen in the ER appears to have postural tachycardia with heart rate rising when he gets up and stands. Is suspected to have postural orthostatic tachycardia and being admitted to the hospital for evaluation. Does not appear to have PE on CT angiogram..  ED Course:Temperature 98.7 blood pressure 166/86 pulse 112 respiratory 26 oxygen sats 95% on room air. White count is 10.9 hemoglobin 15.3 platelet 330. CO2 21 otherwise rest of the chemistries within normal. CT angiogram of the chest showed no PE. Acute viral screen is negative. TSH 2.832    Discharge Diagnoses:  Principal Problem:   Postural orthostatic tachycardia syndrome Active Problems:   Essential hypertension   HYPERGLYCEMIA   Class 3 severe obesity with serious comorbidity and body mass index (BMI) of 50.0 to 59.9 in adult  [facility]  Suspected orthostatic tachycardia syndrome-better with IV fluids and meclizine. TSH normal 2.8. Positive orthostatic vital signs. 2D echo showed normal LVEF 60 to 65%, circumferential pericardial effusion.  No evidence of cardiac tamponade. Referral to outpatient neuro physical therapy has been made on discharge.  Circumferential pericardial effusion on 2D echo 10/13/2020 No evidence of cardiac tamponade He was seen by Dr. Delton See recommended starting him on metoprolol 25 mg twice a day.  Vertigo, unspecified Worse when he turns his head Last episode was >5 years ago Possibly BPPV PT for [employer] pike maneuver Improved with meclizine. Refrain from alcohol nicotine MSG and caffeine.  Severe morbid obesity BMI 57 Recommend bariatric clinic follow-up outpatient for weight loss.  Essential hypertension BP has been persistently elevated He was started on Norvasc 2.5 mg daily and metoprolol 25 twice daily was added on discharge for better control of tachycardia.  Estimated body mass index is 57.41 kg/m as calculated from the following:   Height as of this encounter: 6' (1.829 m).   Weight as of this encounter: 192 kg.  Discharge Instructions   Allergies as of 10/14/2020   No Known Allergies     Medication List    STOP taking these medications   ibuprofen 200 MG tablet Commonly known as: ADVIL   meloxicam 7.5 MG tablet Commonly known as: Mobic   polyethylene glycol 17 g packet Commonly known as: MIRALAX / GLYCOLAX   Vitamin D (Ergocalciferol) 1.25 MG (50000 UNIT) Caps capsule Commonly known as: DRISDOL     TAKE these medications   amLODipine 2.5 MG tablet Commonly known as: NORVASC Take 1 tablet (2.5 mg total) by mouth daily. Start taking on: October 15, 2020   meclizine  25 MG tablet Commonly known as: ANTIVERT Take 1 tablet (25 mg total) by mouth 3 (three) times daily.   ondansetron 4 MG tablet Commonly known as: ZOFRAN Take 1 tablet (4 mg  total) by mouth every 6 (six) hours as needed for nausea.       No Known Allergies  Consultations:  Cardiology   Procedures/Studies: CT Angio Chest PE W/Cm &/Or Wo Cm  Result Date: 10/12/2020 CLINICAL DATA:  Worsening dizziness and palpitations. EXAM: CT ANGIOGRAPHY CHEST WITH CONTRAST TECHNIQUE: Multidetector CT imaging of the chest was performed using the standard protocol during bolus administration of intravenous contrast. Multiplanar CT image reconstructions and MIPs were obtained to evaluate the vascular anatomy. CONTRAST:  OMNIPAQUE IOHEXOL 350 MG/ML SOLN COMPARISON:  None. FINDINGS: Cardiovascular: Satisfactory opacification of the pulmonary arteries to the segmental level. No evidence of pulmonary embolism. Normal heart size. No pericardial effusion. Mediastinum/Nodes: No enlarged mediastinal, hilar, or axillary lymph nodes. An 8 mm cystic appearing area is seen within the left lobe of the thyroid gland. The trachea and esophagus demonstrate no significant findings. Lungs/Pleura: Lungs are clear. No pleural effusion or pneumothorax. Upper Abdomen: There is diffuse fatty infiltration of the liver parenchyma. Musculoskeletal: No chest wall abnormality. No acute or significant osseous findings. Review of the MIP images confirms the above findings. IMPRESSION: 1. No CT evidence of pulmonary embolism or other acute intrathoracic process. 2. Fatty liver. Electronically Signed   By: Aram Candela M.D.   On: 10/12/2020 22:20   DG Chest Port 1 View  Result Date: 10/12/2020 CLINICAL DATA:  Dizziness for several weeks EXAM: PORTABLE CHEST 1 VIEW COMPARISON:  Radiograph 08/27/2019 FINDINGS: Accounting for body habitus, the lungs are clear. Chronic elevation the right hemidiaphragm some adjacent atelectatic changes. No consolidation, features of edema, pneumothorax, or effusion. The cardiomediastinal contours are unremarkable. No acute osseous or soft tissue abnormality. Telemetry leads  overlie the chest. IMPRESSION: Chronic elevation of the right hemidiaphragm with adjacent atelectatic changes. No acute cardiopulmonary disease. Electronically Signed   By: Kreg Shropshire M.D.   On: 10/12/2020 21:23   ECHOCARDIOGRAM COMPLETE  Result Date: 10/13/2020    ECHOCARDIOGRAM REPORT   Patient Name:   DEWANE TIMSON Date of Exam: 10/13/2020 Medical Rec #:  [id number]         Height:       72.0 in Accession #:    [id number]        Weight:       423.3 lb Date of Birth:  1970-11-02         BSA:          2.930 m Patient Age:    49 years          BP:           122/51 mmHg Patient Gender: M                 HR:           76 bpm. Exam Location:  Inpatient Procedure: 2D Echo, Color Doppler and Cardiac Doppler Indications:    R00.2 Palpitations  History:        Patient has no prior history of Echocardiogram examinations.                 Risk Factors:Hypertension and Sleep Apnea.  Sonographer:    Irving Burton Senior RDCS Referring Phys: [id number] Rometta Emery  Sonographer Comments: Suboptimal apical and subcostal windows due to body habitus. IMPRESSIONS  1. Left ventricular ejection  fraction, by estimation, is 60 to 65%. The left ventricle has normal function. The left ventricle has no regional wall motion abnormalities. Left ventricular diastolic parameters were normal.  2. Right ventricular systolic function is normal. The right ventricular size is normal.  3. The pericardial effusion is circumferential. There is no evidence of cardiac tamponade.  4. The mitral valve is grossly normal. No evidence of mitral valve regurgitation. No evidence of mitral stenosis.  5. The aortic valve is grossly normal. Aortic valve regurgitation is not visualized. No aortic stenosis is present. FINDINGS  Left Ventricle: Left ventricular ejection fraction, by estimation, is 60 to 65%. The left ventricle has normal function. The left ventricle has no regional wall motion abnormalities. The left ventricular internal cavity size was normal in  size. There is  no left ventricular hypertrophy. Left ventricular diastolic parameters were normal. Right Ventricle: The right ventricular size is normal. No increase in right ventricular wall thickness. Right ventricular systolic function is normal. Left Atrium: Left atrial size was normal in size. Right Atrium: Right atrial size was normal in size. Pericardium: Trivial pericardial effusion is present. The pericardial effusion is circumferential. There is no evidence of cardiac tamponade. Presence of pericardial fat pad. Mitral Valve: The mitral valve is grossly normal. No evidence of mitral valve regurgitation. No evidence of mitral valve stenosis. Tricuspid Valve: The tricuspid valve is grossly normal. Tricuspid valve regurgitation is not demonstrated. No evidence of tricuspid stenosis. Aortic Valve: The aortic valve is grossly normal. Aortic valve regurgitation is not visualized. No aortic stenosis is present. Pulmonic Valve: The pulmonic valve was grossly normal. Pulmonic valve regurgitation is not visualized. No evidence of pulmonic stenosis. Aorta: The aortic root and ascending aorta are structurally normal, with no evidence of dilitation. Venous: The inferior vena cava was not well visualized. IAS/Shunts: The atrial septum is grossly normal.  LEFT VENTRICLE PLAX 2D LVIDd:         5.00 cm  Diastology LVIDs:         2.60 cm  LV e' medial:    12.20 cm/s LV PW:         1.00 cm  LV E/e' medial:  5.9 LV IVS:        1.20 cm  LV e' lateral:   11.00 cm/s LVOT diam:     2.10 cm  LV E/e' lateral: 6.6 LV SV:         105 LV SV Index:   36 LVOT Area:     3.46 cm  RIGHT VENTRICLE RV S prime:     11.40 cm/s TAPSE (M-mode): 2.2 cm LEFT ATRIUM             Index       RIGHT ATRIUM           Index LA diam:        4.10 cm 1.40 cm/m  RA Area:     16.50 cm LA Vol (A2C):   59.9 ml 20.44 ml/m RA Volume:   42.00 ml  14.33 ml/m LA Vol (A4C):   73.3 ml 25.02 ml/m LA Biplane Vol: 67.2 ml 22.93 ml/m  AORTIC VALVE LVOT Vmax:    143.00 cm/s LVOT Vmean:  88.600 cm/s LVOT VTI:    0.302 m  AORTA Ao Root diam: 2.80 cm Ao Asc diam:  2.80 cm MITRAL VALVE MV Area (PHT): 2.37 cm    SHUNTS MV Decel Time: 320 msec    Systemic VTI:  0.30 m MV E velocity: 72.20 cm/s  Systemic Diam: 2.10 cm MV A velocity: 73.80 cm/s MV E/A ratio:  0.98 Lennie OdorWesley O'Neal MD Electronically signed by Lennie OdorWesley O'Neal MD Signature Date/Time: 10/13/2020/11:35:26 AM    Final     (Echo, Carotid, EGD, Colonoscopy, ERCP)    Subjective: Patient resting in bed feeling better was able to ambulate by himself anxious to go home  Discharge Exam: Vitals:   10/13/20 2123 10/14/20 0940  BP: 140/67 (!) 142/63  Pulse: 82   Resp: 20   Temp: 97.8 F (36.6 C)   SpO2: 99%    Vitals:   10/13/20 1146 10/13/20 1842 10/13/20 2123 10/14/20 0940  BP: (!) 156/73 137/70 140/67 (!) 142/63  Pulse: 82 78 82   Resp: 18 16 20    Temp: 98 F (36.7 C) 98.4 F (36.9 C) 97.8 F (36.6 C)   TempSrc: Oral Oral Oral   SpO2: 98%  99%   Weight:      Height:        General: Pt is alert, awake, not in acute distress Cardiovascular: RRR, S1/S2 +, no rubs, no gallops Respiratory: CTA bilaterally, no wheezing, no rhonchi Abdominal: Soft, NT, ND, bowel sounds + Extremities: no edema, no cyanosis    The results of significant diagnostics from this hospitalization (including imaging, microbiology, ancillary and laboratory) are listed below for reference.     Microbiology: Recent Results (from the past 240 hour(s))  Resp Panel by RT-PCR (Flu A&B, Covid) Nasopharyngeal Swab     Status: None   Collection Time: 10/12/20 11:07 PM   Specimen: Nasopharyngeal Swab; Nasopharyngeal(NP) swabs in vial transport medium  Result Value Ref Range Status   SARS Coronavirus 2 by RT PCR NEGATIVE NEGATIVE Final    Comment: (NOTE) SARS-CoV-2 target nucleic acids are NOT DETECTED.  The SARS-CoV-2 RNA is generally detectable in upper respiratory specimens during the acute phase of infection. The  lowest concentration of SARS-CoV-2 viral copies this assay can detect is 138 copies/mL. A negative result does not preclude SARS-Cov-2 infection and should not be used as the sole basis for treatment or other patient management decisions. A negative result may occur with  improper specimen collection/handling, submission of specimen other than nasopharyngeal swab, presence of viral mutation(s) within the areas targeted by this assay, and inadequate number of viral copies(<138 copies/mL). A negative result must be combined with clinical observations, patient history, and epidemiological information. The expected result is Negative.  Fact Sheet for Patients:  BloggerCourse.comhttps://www.fda.gov/media/152166/download  Fact Sheet for Healthcare Providers:  SeriousBroker.ithttps://www.fda.gov/media/152162/download  This test is no t yet approved or cleared by the [place] FDA and  has been authorized for detection and/or diagnosis of SARS-CoV-2 by FDA under an Emergency Use Authorization (EUA). This EUA will remain  in effect (meaning this test can be used) for the duration of the COVID-19 declaration under Section 564(b)(1) of the Act, 21 U.S.C.section 360bbb-3(b)(1), unless the authorization is terminated  or revoked sooner.       Influenza A by PCR NEGATIVE NEGATIVE Final   Influenza B by PCR NEGATIVE NEGATIVE Final    Comment: (NOTE) The Xpert Xpress SARS-CoV-2/FLU/RSV plus assay is intended as an aid in the diagnosis of influenza from Nasopharyngeal swab specimens and should not be used as a sole basis for treatment. Nasal washings and aspirates are unacceptable for Xpert Xpress SARS-CoV-2/FLU/RSV testing.  Fact Sheet for Patients: BloggerCourse.comhttps://www.fda.gov/media/152166/download  Fact Sheet for Healthcare Providers: SeriousBroker.ithttps://www.fda.gov/media/152162/download  This test is not yet approved or cleared by the [place] FDA and has been authorized for  detection and/or diagnosis of SARS-CoV-2 by FDA under  an Emergency Use Authorization (EUA). This EUA will remain in effect (meaning this test can be used) for the duration of the COVID-19 declaration under Section 564(b)(1) of the Act, 21 U.S.C. section 360bbb-3(b)(1), unless the authorization is terminated or revoked.  Performed at [facility], [address] [place], [place] [address]      Labs: BNP (last 3 results) No results for input(s): BNP in the last 8760 hours. Basic Metabolic Panel: Recent Labs  Lab 10/12/20 1918 10/13/20 0423  NA 138 137  K 4.6 3.6  CL 104 102  CO2 21* 23  GLUCOSE 87 101*  BUN 14 13  CREATININE 0.85 0.91  CALCIUM 9.2 8.9   Liver Function Tests: Recent Labs  Lab 10/13/20 0423  AST 20  ALT 33  ALKPHOS 71  BILITOT 0.8  PROT 7.2  ALBUMIN 3.9   No results for input(s): LIPASE, AMYLASE in the last 168 hours. No results for input(s): AMMONIA in the last 168 hours. CBC: Recent Labs  Lab 10/12/20 1918 10/13/20 0423  WBC 10.9* 10.6*  HGB 15.3 14.3  HCT 47.4 44.4  MCV 86.0 86.2  PLT 330 321   Cardiac Enzymes: No results for input(s): CKTOTAL, CKMB, CKMBINDEX, TROPONINI in the last 168 hours. BNP: Invalid input(s): POCBNP CBG: Recent Labs  Lab 10/12/20 1724  GLUCAP 135*   D-Dimer No results for input(s): DDIMER in the last 72 hours. Hgb A1c No results for input(s): HGBA1C in the last 72 hours. Lipid Profile No results for input(s): CHOL, HDL, LDLCALC, TRIG, CHOLHDL, LDLDIRECT in the last 72 hours. Thyroid function studies Recent Labs    10/12/20 2123  TSH 2.832   Anemia work up No results for input(s): VITAMINB12, FOLATE, FERRITIN, TIBC, IRON, RETICCTPCT in the last 72 hours. Urinalysis    Component Value Date/Time   COLORURINE YELLOW 09/09/2016 0200   APPEARANCEUR CLOUDY (A) 09/09/2016 0200   LABSPEC 1.026 09/09/2016 0200   PHURINE 6.0 09/09/2016 0200   GLUCOSEU NEGATIVE 09/09/2016 0200   HGBUR LARGE (A) 09/09/2016 0200   BILIRUBINUR SMALL (A)  09/09/2016 0200   KETONESUR 15 (A) 09/09/2016 0200   PROTEINUR NEGATIVE 09/09/2016 0200   NITRITE NEGATIVE 09/09/2016 0200   LEUKOCYTESUR NEGATIVE 09/09/2016 0200   Sepsis Labs Invalid input(s): PROCALCITONIN,  WBC,  LACTICIDVEN Microbiology Recent Results (from the past 240 hour(s))  Resp Panel by RT-PCR (Flu A&B, Covid) Nasopharyngeal Swab     Status: None   Collection Time: 10/12/20 11:07 PM   Specimen: Nasopharyngeal Swab; Nasopharyngeal(NP) swabs in vial transport medium  Result Value Ref Range Status   SARS Coronavirus 2 by RT PCR NEGATIVE NEGATIVE Final    Comment: (NOTE) SARS-CoV-2 target nucleic acids are NOT DETECTED.  The SARS-CoV-2 RNA is generally detectable in upper respiratory specimens during the acute phase of infection. The lowest concentration of SARS-CoV-2 viral copies this assay can detect is 138 copies/mL. A negative result does not preclude SARS-Cov-2 infection and should not be used as the sole basis for treatment or other patient management decisions. A negative result may occur with  improper specimen collection/handling, submission of specimen other than nasopharyngeal swab, presence of viral mutation(s) within the areas targeted by this assay, and inadequate number of viral copies(<138 copies/mL). A negative result must be combined with clinical observations, patient history, and epidemiological information. The expected result is Negative.  Fact Sheet for Patients:  BloggerCourse.com  Fact Sheet for Healthcare Providers:  SeriousBroker.it  This  test is no t yet approved or cleared by the [place] and  has been authorized for detection and/or diagnosis of SARS-CoV-2 by FDA under an Emergency Use Authorization (EUA). This EUA will remain  in effect (meaning this test can be used) for the duration of the COVID-19 declaration under Section 564(b)(1) of the Act, 21 U.S.C.section 360bbb-3(b)(1),  unless the authorization is terminated  or revoked sooner.       Influenza A by PCR NEGATIVE NEGATIVE Final   Influenza B by PCR NEGATIVE NEGATIVE Final    Comment: (NOTE) The Xpert Xpress SARS-CoV-2/FLU/RSV plus assay is intended as an aid in the diagnosis of influenza from Nasopharyngeal swab specimens and should not be used as a sole basis for treatment. Nasal washings and aspirates are unacceptable for Xpert Xpress SARS-CoV-2/FLU/RSV testing.  Fact Sheet for Patients: BloggerCourse.com  Fact Sheet for Healthcare Providers: SeriousBroker.it  This test is not yet approved or cleared by the [place] FDA and has been authorized for detection and/or diagnosis of SARS-CoV-2 by FDA under an Emergency Use Authorization (EUA). This EUA will remain in effect (meaning this test can be used) for the duration of the COVID-19 declaration under Section 564(b)(1) of the Act, 21 U.S.C. section 360bbb-3(b)(1), unless the authorization is terminated or revoked.  Performed at [facility], [address] [place], [place] [address]      Time coordinating discharge: 39 minutes  SIGNED:   Alwyn Ren, MD  [facility] 10/14/2020, 11:05 AM

## 2020-10-14 NOTE — Progress Notes (Signed)
Pt to be discharged to home this afternoon. Discharge AVS instructions reviewed with the Pt. Medications and the schedule reviewed with the Pt.  Pt verbalized understanding of all discharge instructions. AVS paperwork with the Pt at time of discharge

## 2020-10-14 NOTE — Consult Note (Addendum)
Cardiology Consultation:   Patient ID: Tristan Perez; [id number]; 05-07-1971   Admit date: 10/12/2020 Date of Consult: 10/14/2020  Primary Care Provider: Farris HasMorrow, Aaron, MD Primary Cardiologist: [facility]   Patient Profile:   Tristan Perez is a 49 y.o. male with a hx of hypertension, ADHD, morbid obesity, OSA and prediabetes who is being seen today for the evaluation of postural tachycardia at the request of Dr. Margo AyeHall.  History of Present Illness:   Tristan Perez is a 49 year old male with a history stated above who [facility] with what he describes as "swaying with standing". He states that approximately two weeks ago he began to have issues with uncontrolled swaying while standing. He states that his symptoms were worse with turning his head sharply. He has been treated for vertigo in the past and felt that his symptoms were very similar. Typically, his symptoms would last a week and then would resolve on their own. Given the duration of his symptoms, he saw his PCP who referred him out to ENT. He reported intermittent palpations therefore he checked his HR and noted it to be in the low 100's. He states that his baseline HR is typically in the 90 range. Given that his symptoms persisted over the weekend and he presented for further evaluation. He denies associated symptoms such as chest pain, N/V, diaphoresis, no presyncopal or syncopal episodes. He reports adequate fluid intake and denies recent illness.   On presentation, BP was elevated at 166/86 with a HR of 112 bpm although noted in the EDP chart that HR's rose to the 140 range with standing. Chest CTA negative for PE.  SH within normal limits at 2.832.  Orthostatic vital signs performed which were found to be positive although I cannot find these vitals. Echocardiogram showed LVEF at 60 to 65% with circumferential pericardial effusion with no evidence of tamponade.   He states that now his BP is more stable and  he is being treated with meclizine, his symptoms have improved.    Past Medical History:  Diagnosis Date  . ADHD   . Constipation    AND ALTERNATES WITH DIARRHEA AND SOME BLOOD SINCE END OF JUNE 2020  . Eczema   . Fatty liver   . High blood pressure   . History of kidney stones   . History of urinary tract infection   . Hypertension    Pt states was on beta blocker 10 years ago but took only for 6 months   . Kidney stones   . Obesity   . Palpitations   . Prediabetes     Past Surgical History:  Procedure Laterality Date  . COLONOSCOPY WITH PROPOFOL N/A 06/22/2019   Procedure: COLONOSCOPY WITH PROPOFOL;  Surgeon: Carman ChingEdwards, James, MD;  Location: [facility];  Service: Endoscopy;  Laterality: N/A;  . CYSTOSCOPY/URETEROSCOPY/HOLMIUM LASER/STENT PLACEMENT Left 09/14/2016   Procedure: CYSTOSCOPY/RETROGRADES/URETEROSCOPY/HOLMIUM LASER/STENT PLACEMENT;  Surgeon: Heloise PurpuraLester Borden, MD;  Location: [facility];  Service: Urology;  Laterality: Left;  . extraction of wisdom teeth       Prior to Admission medications   Medication Sig Start Date End Date Taking? Authorizing Provider  ibuprofen (ADVIL) 200 MG tablet Take 400 mg by mouth every 6 (six) hours as needed for headache or moderate pain.    [provider]  meloxicam (MOBIC) 7.5 MG tablet Take 1 tablet (7.5 mg total) by mouth daily. Patient not taking: Reported on 10/13/2020 08/27/19   Henderly, Britni A, PA-C  polyethylene  glycol (MIRALAX / GLYCOLAX) 17 g packet Take 17 g by mouth every evening. Patient not taking: Reported on 10/13/2020    [provider]  Vitamin D, Ergocalciferol, (DRISDOL) 1.25 MG (50000 UT) CAPS capsule Take 1 capsule (50,000 Units total) by mouth every 7 (seven) days. Patient not taking: Reported on 10/13/2020 08/29/19   Roswell Nickel, DO    Inpatient Medications: Scheduled Meds: . amLODipine  2.5 mg Oral Daily  . enoxaparin (LOVENOX) injection  95 mg Subcutaneous Q24H  . meclizine  25 mg Oral TID    Continuous Infusions: . sodium chloride 20 mL/hr at 10/12/20 2127  . sodium chloride Stopped (10/14/20 0239)   PRN Meds: acetaminophen **OR** acetaminophen, ondansetron **OR** ondansetron (ZOFRAN) IV  Allergies:   No Known Allergies  Social History:   Social History   Socioeconomic History  . Marital status: Single    Spouse name: Not on file  . Number of children: 1  . Years of education: Not on file  . Highest education level: Not on file  Occupational History  . Not on file  Tobacco Use  . Smoking status: Never Smoker  . Smokeless tobacco: Never Used  Vaping Use  . Vaping Use: Never used  Substance and Sexual Activity  . Alcohol use: No  . Drug use: No  . Sexual activity: Not on file  Other Topics Concern  . Not on file  Social History Narrative  . Not on file   Social Determinants of Health   Financial Resource Strain: Not on file  Food Insecurity: Not on file  Transportation Needs: Not on file  Physical Activity: Not on file  Stress: Not on file  Social Connections: Not on file  Intimate Partner Violence: Not on file    Family History:   Family History  Problem Relation Age of Onset  . Diabetes Father   . Hypertension Father   . Obesity Father   . Stroke Other   . Hyperlipidemia Mother    Family Status:  Family Status  Relation Name Status  . Father  (Not Specified)  . Other  (Not Specified)  . Mother  (Not Specified)    ROS:  Please see the history of present illness.  All other ROS reviewed and negative.     Physical Exam/Data:   Vitals:   10/13/20 0449 10/13/20 1146 10/13/20 1842 10/13/20 2123  BP: (!) 122/51 (!) 156/73 137/70 140/67  Pulse: 80 82 78 82  Resp: 20 18 16 20   Temp: 99.1 F (37.3 C) 98 F (36.7 C) 98.4 F (36.9 C) 97.8 F (36.6 C)  TempSrc: Oral Oral Oral Oral  SpO2: 95% 98%  99%  Weight:      Height:        Intake/Output Summary (Last 24 hours) at 10/14/2020 0733 Last data filed at 10/14/2020 0239 Gross per  24 hour  Intake 2486.27 ml  Output --  Net 2486.27 ml   Filed Weights   10/12/20 1719 10/13/20 0117  Weight: (!) 192.3 kg (!) 192 kg   Body mass index is 57.41 kg/m.   General: Obese,  NAD Neck: Negative for carotid bruits. No JVD Lungs:Clear to ausculation bilaterally. No wheezes, rales, or rhonchi. Breathing is unlabored. Cardiovascular: RRR with S1 S2. No murmurs, rubs, gallops, or LV heave appreciated. Extremities: No edema. Radial pulses 2+ bilaterally Neuro: Alert and oriented. No focal deficits. No facial asymmetry. MAE spontaneously. Psych: Responds to questions appropriately with normal affect.     EKG:  The EKG was personally reviewed and demonstrates:  10/12/20 ST with HR 110bpm and no acute changes Telemetry:  Telemetry was personally reviewed and demonstrates:  10/14/20 NSR/ST   Relevant CV Studies:  Echocardiogram 10/13/2020:  1. Left ventricular ejection fraction, by estimation, is 60 to 65%. The  left ventricle has normal function. The left ventricle has no regional  wall motion abnormalities. Left ventricular diastolic parameters were  normal.  2. Right ventricular systolic function is normal. The right ventricular  size is normal.  3. The pericardial effusion is circumferential. There is no evidence of  cardiac tamponade.  4. The mitral valve is grossly normal. No evidence of mitral valve  regurgitation. No evidence of mitral stenosis.  5. The aortic valve is grossly normal. Aortic valve regurgitation is not  visualized. No aortic stenosis is present.   Laboratory Data:  Chemistry Recent Labs  Lab 10/12/20 1918 10/13/20 0423  NA 138 137  K 4.6 3.6  CL 104 102  CO2 21* 23  GLUCOSE 87 101*  BUN 14 13  CREATININE 0.85 0.91  CALCIUM 9.2 8.9  GFRNONAA >60 >60  ANIONGAP 13 12    Total Protein  Date Value Ref Range Status  10/13/2020 7.2 6.5 - 8.1 g/dL Final  92/33/0076 6.9 6.0 - 8.5 g/dL Final   Albumin  Date Value Ref Range Status   10/13/2020 3.9 3.5 - 5.0 g/dL Final  22/63/3354 4.5 4.0 - 5.0 g/dL Final   AST  Date Value Ref Range Status  10/13/2020 20 15 - 41 U/L Final   ALT  Date Value Ref Range Status  10/13/2020 33 0 - 44 U/L Final   Alkaline Phosphatase  Date Value Ref Range Status  10/13/2020 71 38 - 126 U/L Final   Total Bilirubin  Date Value Ref Range Status  10/13/2020 0.8 0.3 - 1.2 mg/dL Final   Bilirubin Total  Date Value Ref Range Status  07/10/2019 0.5 0.0 - 1.2 mg/dL Final   Hematology Recent Labs  Lab 10/12/20 1918 10/13/20 0423  WBC 10.9* 10.6*  RBC 5.51 5.15  HGB 15.3 14.3  HCT 47.4 44.4  MCV 86.0 86.2  MCH 27.8 27.8  MCHC 32.3 32.2  RDW 13.8 14.0  PLT 330 321   Cardiac EnzymesNo results for input(s): TROPONINI in the last 168 hours. No results for input(s): TROPIPOC in the last 168 hours.  BNPNo results for input(s): BNP, PROBNP in the last 168 hours.  DDimer No results for input(s): DDIMER in the last 168 hours. TSH:  Lab Results  Component Value Date   TSH 2.832 10/12/2020   Lipids: Lab Results  Component Value Date   CHOL 171 10/02/2008   HDL 26.3 (L) 10/02/2008   LDLCALC 128 (H) 10/02/2008   TRIG 82 10/02/2008   CHOLHDL 6.5 CALC 10/02/2008   TGYB6L: Lab Results  Component Value Date   HGBA1C 5.8 (H) 07/10/2019    Radiology/Studies:  CT Angio Chest PE W/Cm &/Or Wo Cm  Result Date: 10/12/2020 CLINICAL DATA:  Worsening dizziness and palpitations.  IMPRESSION: 1. No CT evidence of pulmonary embolism or other acute intrathoracic process. 2. Fatty liver.   DG Chest Port 1 View  Result Date: 10/12/2020 CLINICAL DATA:  Dizziness for several weeks  IMPRESSION: Chronic elevation of the right hemidiaphragm with adjacent atelectatic changes. No acute cardiopulmonary disease.   TTE: 10/13/2020  1. Left ventricular ejection fraction, by estimation, is 60 to 65%. The  left ventricle has normal function. The left ventricle has no regional  wall motion  abnormalities. Left ventricular diastolic parameters were  normal.  2. Right ventricular systolic function is normal. The right ventricular  size is normal.  3. The pericardial effusion is circumferential. There is no evidence of  cardiac tamponade.  4. The mitral valve is grossly normal. No evidence of mitral valve  regurgitation. No evidence of mitral stenosis.  5. The aortic valve is grossly normal. Aortic valve regurgitation is not  visualized. No aortic stenosis is present.    Assessment and Plan:   1.  Postural tachycardia: -Pt reports recent "swaying" and dizziness with standing with elevated HR on standing in the ED. He has been treated for vertigo in the past, responsive to Dix-Hall maneuvers. He states that baseline HR runs in the 90's and he noticed elevations to the low 100's at home. He states symptoms have improved with better BP control and the addition of meclizine.  -Echocardiogram stable  -Will consider adding beta blocker therapy to help with HR control  -Continue with adequate oral hydration  -Repeat orthostatics   2.  Pericardial effusion: -Circumferential pericardial effusion with no evidence of tamponade -Does not appear to be overtly fluid overloaded on exam -May need IV Lasix and repeat echo in several weeks  3.  Vertigo: -Management per primary team -Has history of past greater than 5 years ago -OT to perform Dix-Hall pike maneuver -Meclizine 3 times daily x3 days initiated with instructions to decrease sodium, tobacco and caffeine intake -Plan for follow-up neurology outpatient  4.  HTN: -Improved, 140/67, 137/70 -Amlodipine 5 -Consider beta blocker therapy   5. OSA: -Multiple episodes of apnea on tele alarm overnight -Needs OP sleep study for CPAP   For questions or updates, please contact [facility] Please consult [email or web address] for contact info under Cardiology/STEMI.   SignedGeorgie Chard NP-C [facility] Pager:  [phone] 10/14/2020 7:33 AM  The patient was seen, examined and discussed with Georgie Chard, NP  and I agree with the above.    49 year old male with a history of morbid obesity, physical inactivity, prediabetes and hypertension with metabolic syndrome as well as vertigo who presented with symptoms of vertigo and what appeared to be postural tachycardia.  No episodes of syncope. Patient seems to be responsive to therapy with meclizine, Dix-Hall maneuvers as well as hydration. His echocardiogram shows normal LV systolic and diastolic function normal RV function and no valvular abnormalities. His echo shows only mild pericardial effusion with no signs of hemodynamic compromise, I have personally reviewed that.  On presentation, BP was elevated at 166/86 with a HR of 112 bpm although noted in the EDP chart that HR's rose to the 140 range with standing. Chest CTA negative for PE.  SH within normal limits at 2.832.  Orthostatic vital signs performed which were found to be positive although I cannot find these vitals. Echocardiogram showed LVEF at 60 to 65% with circumferential pericardial effusion with no evidence of tamponade.   Patient is hypertensive and tachycardic at baseline with increased heart rate upon standing.  He was started on amlodipine 5 mg daily, I will add metoprolol 25 mg p.o. twice daily, if needed amlodipine can be decreased to 2.5 mg daily.  Overall daily exercise with minimum 30 minutes 5 days a week is recommended to improve functional capacity and tachycardic response to any physical activity.  Tobias Alexander, MD 10/14/2020

## 2020-12-04 ENCOUNTER — Other Ambulatory Visit: Payer: Self-pay | Admitting: Cardiology

## 2021-03-10 ENCOUNTER — Other Ambulatory Visit (HOSPITAL_BASED_OUTPATIENT_CLINIC_OR_DEPARTMENT_OTHER): Payer: Self-pay

## 2021-03-10 DIAGNOSIS — G4719 Other hypersomnia: Secondary | ICD-10-CM

## 2021-03-10 DIAGNOSIS — R0683 Snoring: Secondary | ICD-10-CM

## 2021-04-28 ENCOUNTER — Emergency Department (HOSPITAL_BASED_OUTPATIENT_CLINIC_OR_DEPARTMENT_OTHER)
Admission: EM | Admit: 2021-04-28 | Discharge: 2021-04-28 | Disposition: A | Discharge status: Home / Self Care | Payer: No Typology Code available for payment source | Attending: Emergency Medicine | Admitting: Emergency Medicine

## 2021-04-28 ENCOUNTER — Other Ambulatory Visit: Payer: Self-pay

## 2021-04-28 ENCOUNTER — Encounter (HOSPITAL_BASED_OUTPATIENT_CLINIC_OR_DEPARTMENT_OTHER): Payer: Self-pay | Admitting: Emergency Medicine

## 2021-04-28 DIAGNOSIS — L551 Sunburn of second degree: Secondary | ICD-10-CM | POA: Insufficient documentation

## 2021-04-28 DIAGNOSIS — Z79899 Other long term (current) drug therapy: Secondary | ICD-10-CM | POA: Diagnosis not present

## 2021-04-28 DIAGNOSIS — L559 Sunburn, unspecified: Secondary | ICD-10-CM | POA: Diagnosis present

## 2021-04-28 DIAGNOSIS — I10 Essential (primary) hypertension: Secondary | ICD-10-CM | POA: Diagnosis not present

## 2021-04-28 MED ORDER — PREDNISONE 10 MG PO TABS: 20.0000 mg | ORAL_TABLET | Freq: Two times a day (BID) | ORAL | 0 refills | Status: DC

## 2021-04-28 MED ORDER — PREDNISONE 20 MG PO TABS
20.0000 mg | ORAL_TABLET | Freq: Once | ORAL | Status: AC
  Administered 2021-04-28: 23:00:00 20 mg via ORAL
  Filled 2021-04-28: qty 1

## 2021-04-28 NOTE — Discharge Instructions (Addendum)
Begin taking prednisone as prescribed.  Local wound care with bacitracin and dressing changes twice daily.  Follow-up with primary doctor if not improving in the next few days.
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
## 2021-05-19 ENCOUNTER — Ambulatory Visit (HOSPITAL_BASED_OUTPATIENT_CLINIC_OR_DEPARTMENT_OTHER): Payer: No Typology Code available for payment source | Admitting: Sleep Medicine

## 2021-07-15 ENCOUNTER — Ambulatory Visit (HOSPITAL_BASED_OUTPATIENT_CLINIC_OR_DEPARTMENT_OTHER): Payer: No Typology Code available for payment source | Attending: Sleep Medicine | Admitting: Sleep Medicine

## 2021-07-15 ENCOUNTER — Other Ambulatory Visit: Payer: Self-pay

## 2021-07-15 VITALS — Ht 72.0 in | Wt >= 6400 oz

## 2021-07-15 DIAGNOSIS — G4719 Other hypersomnia: Secondary | ICD-10-CM | POA: Insufficient documentation

## 2021-07-15 DIAGNOSIS — R0683 Snoring: Secondary | ICD-10-CM | POA: Insufficient documentation

## 2021-07-15 DIAGNOSIS — G4733 Obstructive sleep apnea (adult) (pediatric): Secondary | ICD-10-CM

## 2021-07-28 NOTE — Procedures (Signed)
   NAME: Tristan Perez DATE OF BIRTH:  09-06-1971 MEDICAL RECORD NUMBER [id number]  LOCATION: [facility]  PHYSICIAN: Leighanna Kirn D Gerad Cornelio  DATE OF STUDY: 07/15/2021  SLEEP STUDY TYPE: Nocturnal Polysomnogram               REFERRING PHYSICIAN: Alanda Slim, MD  CLINICAL INFORMATION Tristan Perez is a 50 year old Male and was referred to the sleep center for evaluation of OSA (G47.80).  MEDICATIONS Patient self administered medications include: Metoprolol tartrate. No sleep medicine administered.  SLEEP STUDY TECHNIQUE A multi-channel overnight Polysomnography study was performed. The channels recorded and monitored were central and occipital EEG, electrooculogram (EOG), submentalis EMG (chin), nasal and oral airflow, thoracic and abdominal wall motion, anterior tibialis EMG, snore microphone, electrocardiogram, and a pulse oximetry. SLEEP ARCHITECTURE The study was initiated at 10:55:25 PM and terminated at 7:59:46 AM. The total recorded time was 544.3 minutes. EEG confirmed total sleep time was 134 minutes yielding a sleep efficiency of 24.6%%. Sleep onset after lights out was 151.2 minutes with a REM latency of 233.0 minutes. The patient spent 28.4%% of the night in stage N1 sleep, 61.9%% in stage N2 sleep, 0.0%% in stage N3 and 9.7% in REM. Wake after sleep onset (WASO) was 259.1 minutes. The Arousal Index was 28.7/hour. RESPIRATORY PARAMETERS There were a total of 9 respiratory disturbances out of which 0 were apneas (0 obstructive, 0 mixed, 0 central) and 9 hypopneas. The apnea/hypopnea index (AHI) was 4.0 events/hour. The central sleep apnea index was 0 events/hour. The REM AHI was 23.1 events/hour and NREM AHI was 2.0 events/hour. The supine AHI was N/A events/hour and the non supine AHI was 4 supine during 0.00% of sleep. Respiratory disturbance index was 7.6 events/per hour and was associated with oxygen desaturation down to a nadir of 86.0% during sleep. The  mean oxygen saturation during the study was 93.2%.  LEG MOVEMENT DATA The total leg movements were 0 with a resulting leg movement index of 0.0/hr .Associated arousal with leg movement index was 0.0/hr.  CARDIAC DATA The underlying cardiac rhythm was most consistent with sinus rhythm. Mean heart rate during sleep was 83.8 bpm. Additional rhythm abnormalities include None. IMPRESSIONS - Mild Obstructive Sleep Apnea based on RDI (OSA) DIAGNOSIS - Obstructive Sleep Apnea (G47.33) RECOMMENDATIONS - For mild OSA, would recommend treatment with either, auto adjusting PAP therapy or oral appliance therapy. - Avoid alcohol, sedatives and other CNS depressants that may worsen sleep apnea and disrupt normal sleep architecture. - Sleep hygiene should be reviewed to assess factors that may improve sleep quality. - Weight management and regular exercise should be initiated or continued.   Ivie Savitt D Jacobo Moncrief Diplomate, American Board of Internal Medicine  ELECTRONICALLY SIGNED ON:  07/28/2021, 1:17 PM [facility] PH: [phone]   FX: [phone] ACCREDITED BY THE AMERICAN ACADEMY OF SLEEP MEDICINE

## 2021-12-07 IMAGING — CT CT ANGIO CHEST
2 of 6 series · 18 of 36 positions shown · IV contrast (omnipaque)
Comparison: None.

CLINICAL DATA: Worsening dizziness and palpitations.

EXAM:
CT ANGIOGRAPHY CHEST WITH CONTRAST
TECHNIQUE: Multidetector CT imaging of the chest was performed using the
standard protocol during bolus administration of intravenous
contrast. Multiplanar CT image reconstructions and MIPs were
obtained to evaluate the vascular anatomy.
CONTRAST:  100mL OMNIPAQUE IOHEXOL 350 MG/ML SOLN

[Series 5: thins · axial · 0.96mm/px · z∈[-305,-76]mm · 17 of 259 slices shown]
[im 15/259  lung]
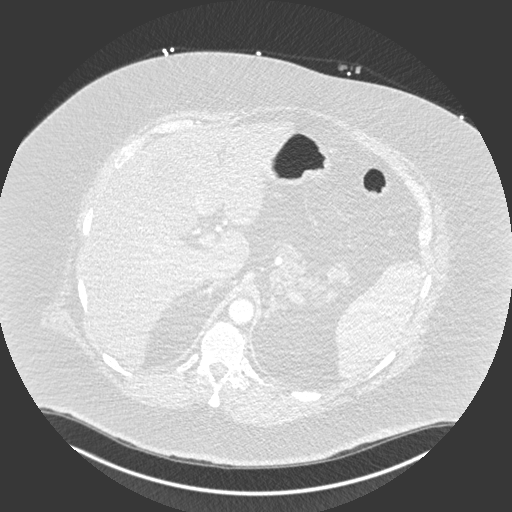
[im 29/259  mediastinal]
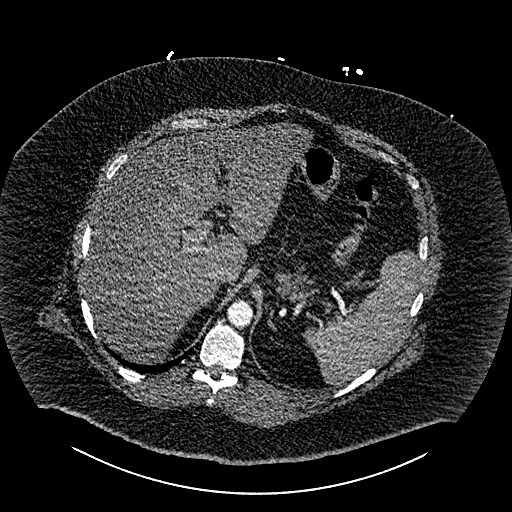
[im 44/259  lung]
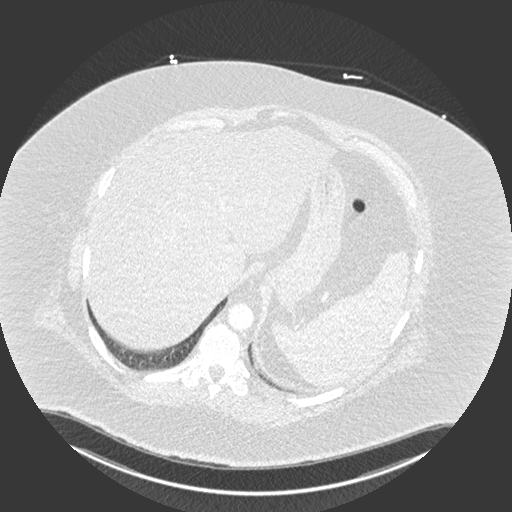
[im 58/259  mediastinal]
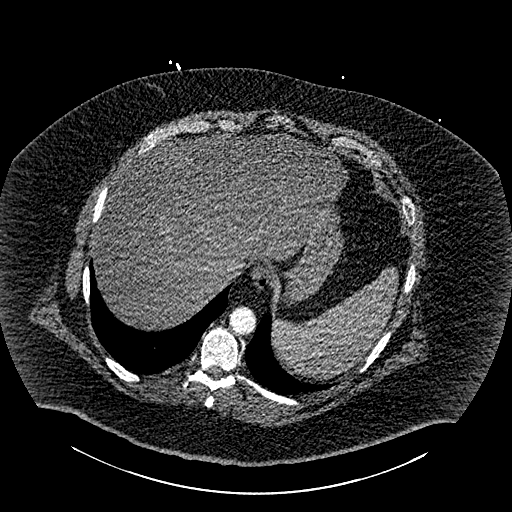
[im 72/259  lung]
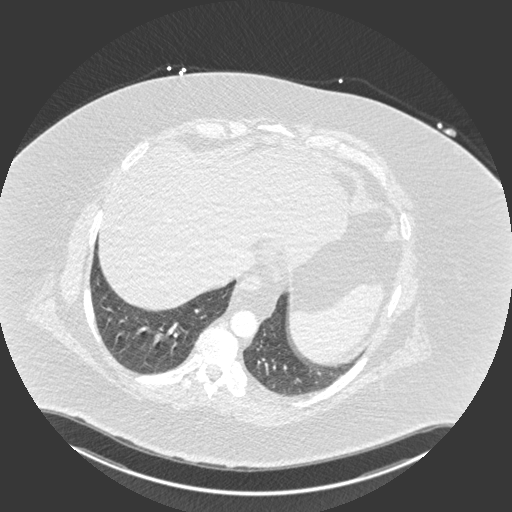
[im 87/259  mediastinal]
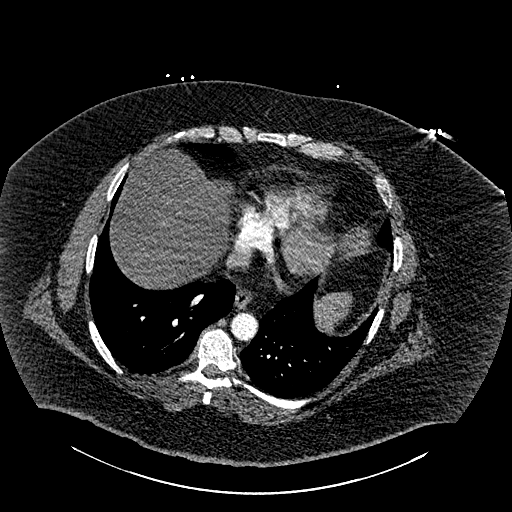
[im 101/259  lung]
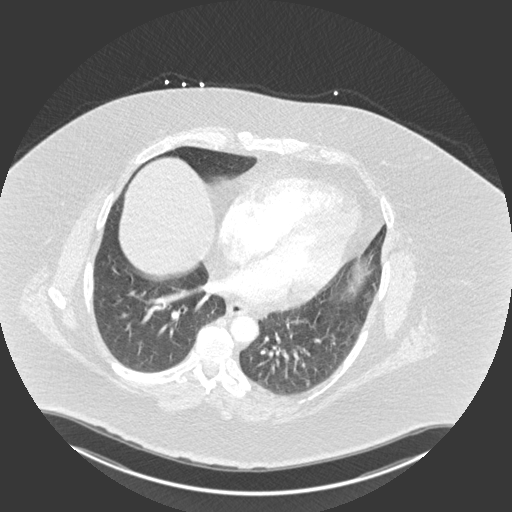
[im 115/259  mediastinal]
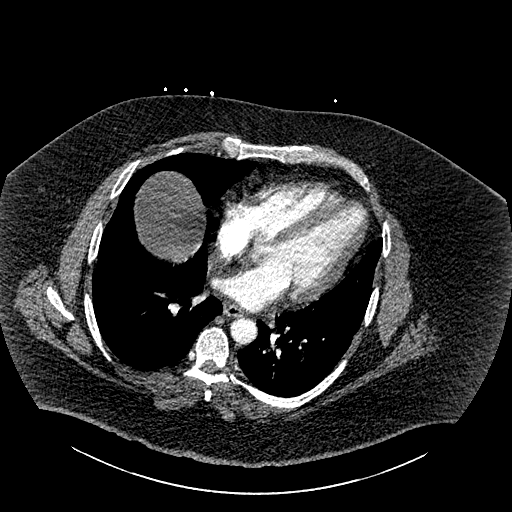
[im 130/259  lung]
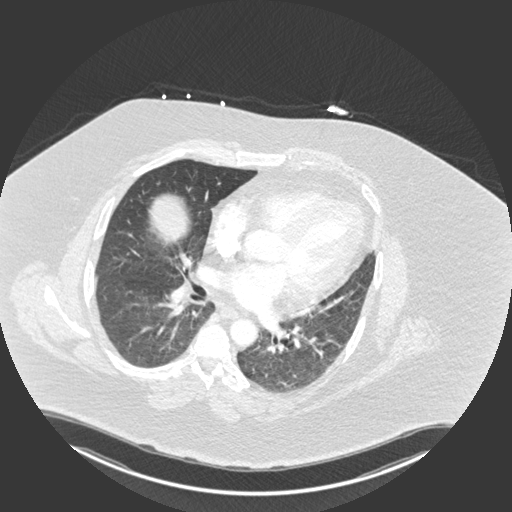
[im 144/259  mediastinal]
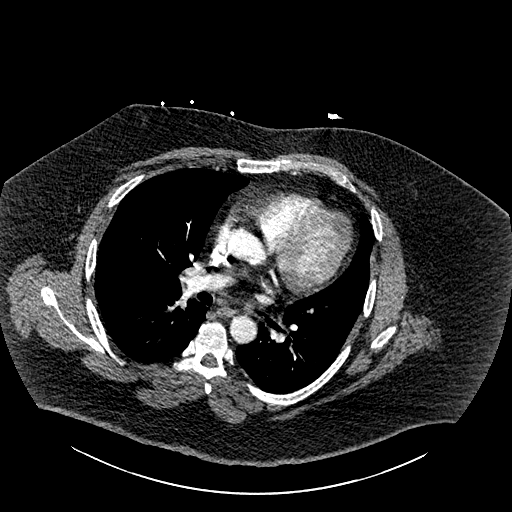
[im 158/259  lung]
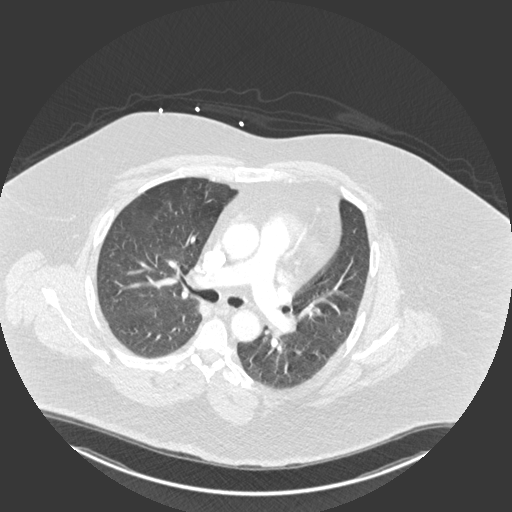
[im 173/259  mediastinal]
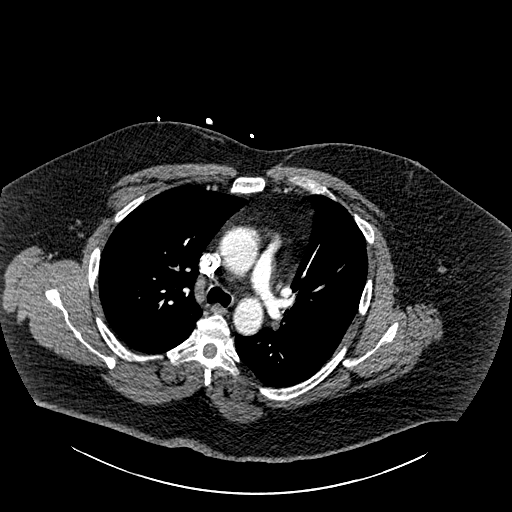
[im 187/259  lung]
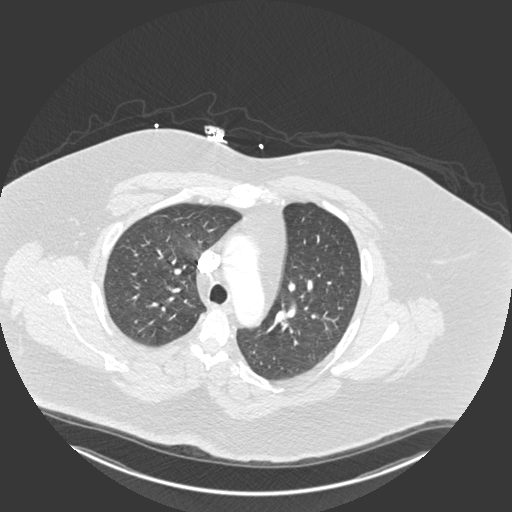
[im 201/259  mediastinal]
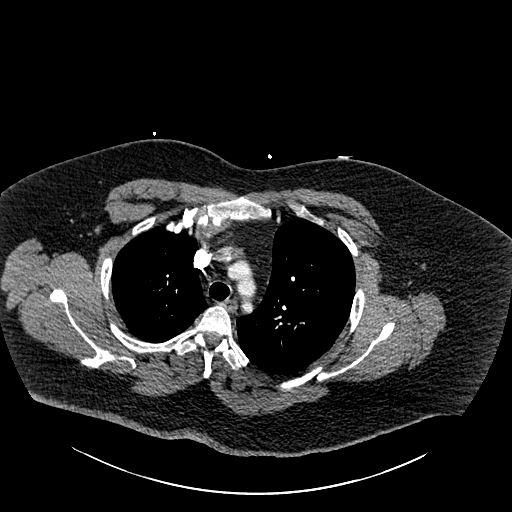
[im 216/259  lung]
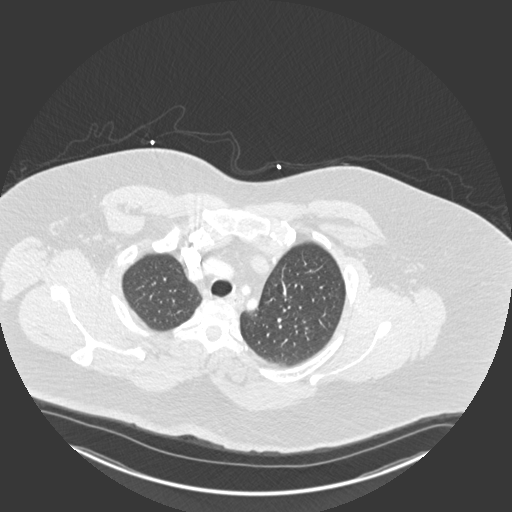
[im 230/259  mediastinal]
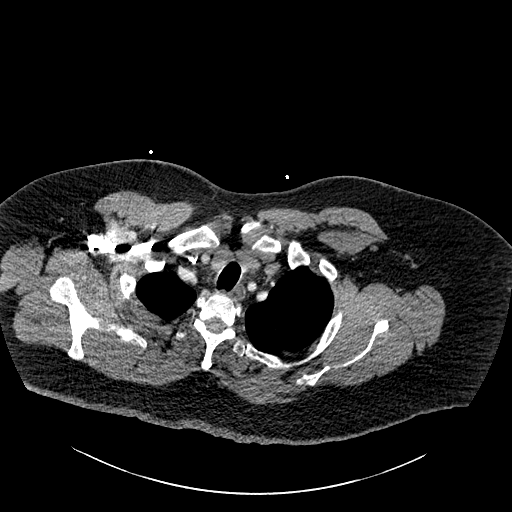
[im 244/259  lung]
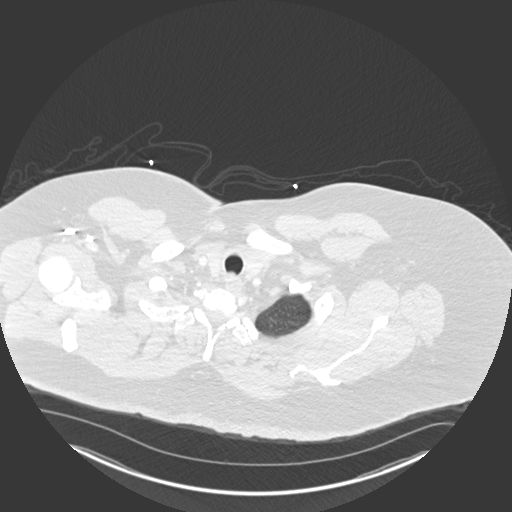

[Series 7: coronal mpr · coronal · 0.60mm/px · 1 of 196 slices shown]
[im 98/196  mediastinal]
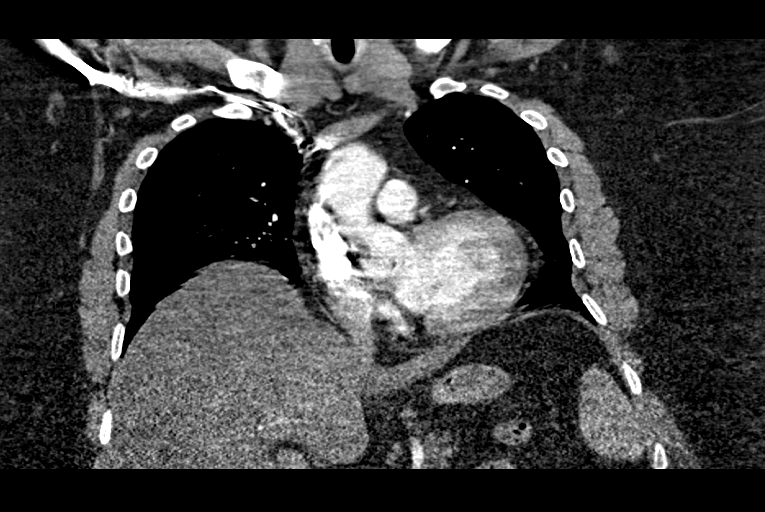

[18 of 36 positions shown; findings below may reference images not displayed]

FINDINGS: Cardiovascular: Satisfactory opacification of the pulmonary arteries
to the segmental level. No evidence of pulmonary embolism. Normal
heart size. No pericardial effusion.

Mediastinum/Nodes: No enlarged mediastinal, hilar, or axillary lymph
nodes. An 8 mm cystic appearing area is seen within the left lobe of
the thyroid gland. The trachea and esophagus demonstrate no
significant findings.

Lungs/Pleura: Lungs are clear. No pleural effusion or pneumothorax.

Upper Abdomen: There is diffuse fatty infiltration of the liver
parenchyma.

Musculoskeletal: No chest wall abnormality. No acute or significant
osseous findings.

Review of the MIP images confirms the above findings.
IMPRESSION: 1. No CT evidence of pulmonary embolism or other acute intrathoracic
process.
2. Fatty liver.

## 2022-04-08 ENCOUNTER — Other Ambulatory Visit (HOSPITAL_BASED_OUTPATIENT_CLINIC_OR_DEPARTMENT_OTHER): Payer: Self-pay | Admitting: Family Medicine

## 2022-04-08 ENCOUNTER — Ambulatory Visit (HOSPITAL_BASED_OUTPATIENT_CLINIC_OR_DEPARTMENT_OTHER)
Admission: RE | Admit: 2022-04-08 | Discharge: 2022-04-08 | Disposition: A | Discharge status: Home / Self Care | Payer: No Typology Code available for payment source | Source: Ambulatory Visit | Attending: Family Medicine | Admitting: Family Medicine

## 2022-04-08 DIAGNOSIS — M7989 Other specified soft tissue disorders: Secondary | ICD-10-CM | POA: Diagnosis present

## 2022-05-27 ENCOUNTER — Ambulatory Visit
Admission: RE | Admit: 2022-05-27 | Discharge: 2022-05-27 | Disposition: A | Discharge status: Home / Self Care | Payer: No Typology Code available for payment source | Source: Ambulatory Visit | Attending: Sports Medicine | Admitting: Sports Medicine

## 2022-05-27 ENCOUNTER — Other Ambulatory Visit: Payer: Self-pay | Admitting: Sports Medicine

## 2022-05-27 DIAGNOSIS — M25551 Pain in right hip: Secondary | ICD-10-CM

## 2022-05-27 DIAGNOSIS — M545 Low back pain, unspecified: Secondary | ICD-10-CM

## 2022-06-03 ENCOUNTER — Encounter (INDEPENDENT_AMBULATORY_CARE_PROVIDER_SITE_OTHER): Payer: Self-pay

## 2022-06-15 ENCOUNTER — Encounter: Payer: Self-pay | Admitting: Physical Therapy

## 2022-06-15 ENCOUNTER — Ambulatory Visit: Payer: 59 | Attending: Sports Medicine | Admitting: Physical Therapy

## 2022-06-15 DIAGNOSIS — R278 Other lack of coordination: Secondary | ICD-10-CM | POA: Insufficient documentation

## 2022-06-15 DIAGNOSIS — M6281 Muscle weakness (generalized): Secondary | ICD-10-CM | POA: Insufficient documentation

## 2022-06-15 DIAGNOSIS — R293 Abnormal posture: Secondary | ICD-10-CM | POA: Diagnosis present

## 2022-06-15 DIAGNOSIS — R262 Difficulty in walking, not elsewhere classified: Secondary | ICD-10-CM | POA: Diagnosis present

## 2022-06-15 DIAGNOSIS — M545 Low back pain, unspecified: Secondary | ICD-10-CM | POA: Insufficient documentation

## 2022-06-15 NOTE — Therapy (Signed)
OUTPATIENT PHYSICAL THERAPY THORACOLUMBAR EVALUATION   Patient Name: Tristan Perez MRN: [id number] DOB:21-Jan-1971, 51 y.o., male Today's Date: 06/15/2022   PT End of Session - 06/15/22 1908     Visit Number 1    PT Start Time 1635    PT Stop Time 1712    PT Time Calculation (min) 37 min    Activity Tolerance Patient tolerated treatment well    Behavior During Therapy WFL for tasks assessed/performed             Past Medical History:  Diagnosis Date   ADHD    Constipation    AND ALTERNATES WITH DIARRHEA AND SOME BLOOD SINCE END OF JUNE 2020   Eczema    Fatty liver    High blood pressure    History of kidney stones    History of urinary tract infection    Hypertension    Pt states was on beta blocker 10 years ago but took only for 6 months    Kidney stones    Obesity    Palpitations    Prediabetes    Past Surgical History:  Procedure Laterality Date   COLONOSCOPY WITH PROPOFOL N/A 06/22/2019   Procedure: COLONOSCOPY WITH PROPOFOL;  Surgeon: Carman Ching, MD;  Location: [facility];  Service: Endoscopy;  Laterality: N/A;   CYSTOSCOPY/URETEROSCOPY/HOLMIUM LASER/STENT PLACEMENT Left 09/14/2016   Procedure: CYSTOSCOPY/RETROGRADES/URETEROSCOPY/HOLMIUM LASER/STENT PLACEMENT;  Surgeon: Heloise Purpura, MD;  Location: [facility];  Service: Urology;  Laterality: Left;   extraction of wisdom teeth     Patient Active Problem List   Diagnosis Date Noted   Excessive daytime sleepiness 07/15/2021   Postural orthostatic tachycardia syndrome 10/12/2020   Prediabetes 07/11/2019   Class 3 severe obesity with serious comorbidity and body mass index (BMI) of 50.0 to 59.9 in adult (HCC) 07/11/2019   Vitamin D deficiency 07/11/2019   ALLERGIC RHINITIS 06/13/2010   SNORING 06/13/2010   HYPERGLYCEMIA 06/03/2010   Essential hypertension 05/28/2010   FATIGUE 05/28/2010   CHEST WALL PAIN, ANTERIOR 06/20/2009   TRANSAMINASES, SERUM, ELEVATED 10/15/2008   MORBID OBESITY 08/17/2008     PCP: Farris Has  REFERRING PROVIDER: Christena Deem, MD   REFERRING DIAG: Diagnosis M54.50 (ICD-10-CM) - Low back pain, unspecified M25.551 (ICD-10-CM) - Pain in right hip   Rationale for Evaluation and Treatment Rehabilitation  THERAPY DIAG:  Acute right-sided low back pain without sciatica  Abnormal posture  Difficulty in walking, not elsewhere classified  Other lack of coordination  Muscle weakness (generalized)  ONSET DATE: 05/26/2022   SUBJECTIVE:  SUBJECTIVE STATEMENT: [place] at the beach, sat in a low beach chair. After he rose, his back started to feel tight and "locked up" on him. 7/10 at the time. He has been performing some exercises in the bed since then.  PERTINENT HISTORY:  Obesity  PAIN:  Are you having pain? Yes: NPRS scale: 5/10 Pain location: low back and R hip Pain description: tightness Aggravating factors: sitting, standing Relieving factors: movement   PRECAUTIONS: None  WEIGHT BEARING RESTRICTIONS No  FALLS:  Has patient fallen in last 6 months? No  LIVING ENVIRONMENT: Lives with: lives with their family Lives in: House/apartment Stairs: Yes: Internal: 12 steps; bilateral but cannot reach both and External: 12 steps; bilateral but cannot reach both Has following equipment at home: None  OCCUPATION: Works from home, sits at a computer.  PLOF: Independent  PATIENT GOALS Less pain. Be able to stand without increased pain. Improved ROM.   OBJECTIVE:   DIAGNOSTIC FINDINGS:  EXAM: LUMBAR SPINE - 2-3 VIEW   COMPARISON:  Mar 09, 2013   FINDINGS: There is no evidence of lumbar spine fracture. Alignment is normal. Intervertebral disc spaces are maintained. Probable minimal anterior osteophytosis is identified in the upper lumbar spine.    IMPRESSION: No acute fracture or dislocation. Probable minimal degenerative joint changes of upper lumbar spine.EXAM: DG HIP (WITH OR WITHOUT PELVIS) 2-3V RIGHT   COMPARISON:  None Available.   FINDINGS: Evaluation on the frontal view is limited due to x-ray imaging technique. There is no evidence of gross hip fracture or dislocation. There is no evidence of gross arthropathy or other focal bone abnormality.   IMPRESSION: Evaluation on the frontal view is limited due to x-ray imaging technique. There is no evidence of gross hip fracture or dislocation. There is no evidence of gross arthropathy or other focal bone abnormality.  PATIENT SURVEYS:  FOTO 59.12  SCREENING FOR RED FLAGS: Bowel or bladder incontinence: No Compression fracture: No   COGNITION:  Overall cognitive status: Within functional limits for tasks assessed     SENSATION: Light touch: WFL  MUSCLE LENGTH: Hamstrings: Right 35 deg; Left 40 deg  POSTURE: rounded shoulders and increased lumbar lordosis  PALPATION: Patient reports mild TTP along R lumbar paraspinals and QL, prox gluts on R.   LUMBAR ROM:   Active  A/PROM  eval  Flexion Mid shin  Extension WFL  Right lateral flexion Mid thigh  Left lateral flexion Prox thigh  Right rotation WFL  Left rotation WFL   (Blank rows = not tested)  LOWER EXTREMITY ROM:     Passive  Right eval Left eval  Hip flexion 90 90  Hip extension    Hip abduction    Hip adduction    Hip internal rotation    Hip external rotation Mod impaired WFL  Knee flexion [facility] WFL  Knee extension    Ankle dorsiflexion    Ankle plantarflexion    Ankle inversion    Ankle eversion     (Blank rows = not tested)  LOWER EXTREMITY MMT:    MMT Right eval Left eval  Hip flexion 4 4  Hip extension 3+ 3+  Hip abduction impaired impaired  Hip adduction        Hip external rotation    Knee flexion 4 4  Knee extension 4 4  Ankle dorsiflexion 4 4  Ankle plantarflexion     Ankle inversion    Ankle eversion     (Blank rows = not tested)  LUMBAR  SPECIAL TESTS:  Slump test Neg, but patient reports tightness with seated knee ext, pulls in HS  GAIT: Distance walked: 100' Assistive device utilized: None Level of assistance: Complete Independence Comments: Wide BOS with hip ER, effortful due to body habitus    TODAY'S TREATMENT  Patient education   PATIENT EDUCATION:  Education details: POC Person educated: Patient Education method: Explanation Education comprehension: verbalized understanding   HOME EXERCISE PROGRAM: TBD  ASSESSMENT:  CLINICAL IMPRESSION: Patient is a 51 y.o. who was seen today for physical therapy evaluation and treatment for Low back pain, R hip pain. He demonstrates decreased ROM in lower body, decreased strength, pain in low back with static stand or sit, stretch to low back with most any leg movements. He will benefit from PT to address his deficits to improve postural control and strength. He also sits throughout the day, needs assessment of his workspace for updates.    OBJECTIVE IMPAIRMENTS Abnormal gait, decreased activity tolerance, decreased coordination, difficulty walking, decreased ROM, decreased strength, impaired flexibility, improper body mechanics, postural dysfunction, obesity, and pain.   ACTIVITY LIMITATIONS bending, sitting, standing, squatting, and locomotion level  PARTICIPATION LIMITATIONS: cleaning, driving, shopping, community activity, occupation, and yard work  PERSONAL FACTORS Past/current experiences are also affecting patient's functional outcome.   REHAB POTENTIAL: Good  CLINICAL DECISION MAKING: Stable/uncomplicated  EVALUATION COMPLEXITY: Low   GOALS: Goals reviewed with patient? Yes  SHORT TERM GOALS: Target date: 07/13/2022  I with basic HEP Baseline: Goal status: INITIAL LONG TERM GOALS: Target date: 08/24/2022  I with final HEP Baseline:  Goal status: INITIAL  2.   Increase FOTO to 70 Baseline: 59 Goal status: INITIAL  3.  Patient will verbalize understanding of appropriate work station set up to minimize back pain Baseline:  Goal status: INITIAL  4.  Patient will be able to sit or stand for at least and hour with back pain <3/10 Baseline: Reports increased pain after 5 minutes of standing. Goal status: INITIAL  PLAN: PT FREQUENCY: 1-2x/week  PT DURATION: 10 weeks  PLANNED INTERVENTIONS: Therapeutic exercises, Therapeutic activity, Neuromuscular re-education, Balance training, Gait training, Patient/Family education, Self Care, Joint mobilization, Dry Needling, Electrical stimulation, Cryotherapy, Moist heat, Ultrasound, Ionotophoresis 4mg /ml Dexamethasone, and Manual therapy.  PLAN FOR NEXT SESSION: Initiate HEP   , DPT 06/15/2022, 7:10 PM

## 2022-06-19 ENCOUNTER — Emergency Department (HOSPITAL_COMMUNITY)
Admission: EM | Admit: 2022-06-19 | Discharge: 2022-06-20 | Disposition: A | Discharge status: Home / Self Care | Payer: 59 | Attending: Emergency Medicine | Admitting: Emergency Medicine

## 2022-06-19 ENCOUNTER — Encounter (HOSPITAL_COMMUNITY): Payer: Self-pay

## 2022-06-19 ENCOUNTER — Other Ambulatory Visit: Payer: Self-pay

## 2022-06-19 DIAGNOSIS — R42 Dizziness and giddiness: Secondary | ICD-10-CM | POA: Diagnosis not present

## 2022-06-19 DIAGNOSIS — R000 Tachycardia, unspecified: Secondary | ICD-10-CM | POA: Diagnosis not present

## 2022-06-19 LAB — CBC WITH DIFFERENTIAL/PLATELET
Abs Immature Granulocytes: 0.1 10*3/uL — ABNORMAL HIGH (ref 0.00–0.07)
Basophils Absolute: 0.1 10*3/uL (ref 0.0–0.1)
Basophils Relative: 1 %
Eosinophils Absolute: 0.2 10*3/uL (ref 0.0–0.5)
Eosinophils Relative: 2 %
HCT: 43.8 % (ref 39.0–52.0)
Hemoglobin: 14.4 g/dL (ref 13.0–17.0)
Immature Granulocytes: 1 %
Lymphocytes Relative: 21 %
Lymphs Abs: 2.2 10*3/uL (ref 0.7–4.0)
MCH: 28.2 pg (ref 26.0–34.0)
MCHC: 32.9 g/dL (ref 30.0–36.0)
MCV: 85.7 fL (ref 80.0–100.0)
Monocytes Absolute: 0.7 10*3/uL (ref 0.1–1.0)
Monocytes Relative: 6 %
Neutro Abs: 7.2 10*3/uL (ref 1.7–7.7)
Neutrophils Relative %: 69 %
Platelets: 327 10*3/uL (ref 150–400)
RBC: 5.11 MIL/uL (ref 4.22–5.81)
RDW: 14.3 % (ref 11.5–15.5)
WBC: 10.4 10*3/uL (ref 4.0–10.5)
nRBC: 0 % (ref 0.0–0.2)

## 2022-06-19 LAB — URINALYSIS, ROUTINE W REFLEX MICROSCOPIC
Bilirubin Urine: NEGATIVE
Glucose, UA: NEGATIVE mg/dL
Ketones, ur: NEGATIVE mg/dL
Leukocytes,Ua: NEGATIVE
Nitrite: NEGATIVE
Protein, ur: 30 mg/dL — AB
RBC / HPF: >50 RBC/hpf — ABNORMAL HIGH (ref 0–5)
Specific Gravity, Urine: 1.024 (ref 1.005–1.030)
pH: 5 (ref 5.0–8.0)

## 2022-06-19 LAB — COMPREHENSIVE METABOLIC PANEL
ALT: 33 U/L (ref 0–44)
AST: 26 U/L (ref 15–41)
Albumin: 3.9 g/dL (ref 3.5–5.0)
Alkaline Phosphatase: 82 U/L (ref 38–126)
Anion gap: 10 (ref 5–15)
BUN: 15 mg/dL (ref 6–20)
CO2: 23 mmol/L (ref 22–32)
Calcium: 9.6 mg/dL (ref 8.9–10.3)
Chloride: 105 mmol/L (ref 98–111)
Creatinine, Ser: 0.82 mg/dL (ref 0.61–1.24)
GFR, Estimated: >60 mL/min (ref >60)
Glucose, Bld: 102 mg/dL — ABNORMAL HIGH (ref 70–99)
Potassium: 3.8 mmol/L (ref 3.5–5.1)
Sodium: 138 mmol/L (ref 135–145)
Total Bilirubin: 0.5 mg/dL (ref 0.3–1.2)
Total Protein: 7.7 g/dL (ref 6.5–8.1)

## 2022-06-19 NOTE — ED Triage Notes (Signed)
Pt reports dizziness for the last couple days and "swaying when he walks".   Also sts tachycardia. Noticed hr > 150 while walking.

## 2022-06-19 NOTE — ED Provider Triage Note (Cosign Needed Addendum)
Emergency Medicine Provider Triage Evaluation Note  Tristan Perez , a 51 y.o. male  was evaluated in triage.  Pt complains of tachycardia. States that same has been ongoing for the past few days. Also states that he feels like he is swaying when he walks. Noted that his heart rate is getting up to the 150s when he walks. States he was recently diagnosed with diabetes and started on metformin. He endorses compliance with this medication. He does not check his blood sugars at home. He denies any pain, fevers, chest pain, shortness of breath, nausea, or vomiting. He states that a similar episode occurred 2 years ago and he was started on metoprolol for same which he is still taking.  Review of Systems  Positive:  Negative:   Physical Exam  BP (!) 161/92 (BP Location: Left Arm)   Pulse (!) 122   Temp 98.9 F (37.2 C) (Oral)   Resp 18   Ht 6' (1.829 m)   Wt (!) 190.5 kg   SpO2 96%   BMI 56.96 kg/m  Gen:   Awake, no distress   Resp:  Normal effort  MSK:   Moves extremities without difficulty  Other:    Medical Decision Making  Medically screening exam initiated at 10:05 PM.  Appropriate orders placed.  OBEDIAH WELLES was informed that the remainder of the evaluation will be completed by another provider, this initial triage assessment does not replace that evaluation, and the importance of remaining in the ED until their evaluation is complete.     Vear Clock 06/19/22 2209    Silva Bandy, PA-C 06/19/22 2210

## 2022-06-20 LAB — TROPONIN I (HIGH SENSITIVITY)
Troponin I (High Sensitivity): 21 ng/L — ABNORMAL HIGH (ref <18)
Troponin I (High Sensitivity): 23 ng/L — ABNORMAL HIGH (ref <18)

## 2022-06-20 LAB — D-DIMER, QUANTITATIVE: D-Dimer, Quant: <0.27 ug/mL-FEU (ref 0.00–0.50)

## 2022-06-20 LAB — TSH: TSH: 3.007 u[IU]/mL (ref 0.350–4.500)

## 2022-06-20 LAB — CBG MONITORING, ED: Glucose-Capillary: 112 mg/dL — ABNORMAL HIGH (ref 70–99)

## 2022-06-20 MED ORDER — METOPROLOL TARTRATE 25 MG PO TABS
25.0000 mg | ORAL_TABLET | Freq: Once | ORAL | Status: AC
  Administered 2022-06-20: 25 mg via ORAL
  Filled 2022-06-20: qty 1

## 2022-06-20 NOTE — Discharge Instructions (Signed)
Get help right away if you: Have pain in your chest, upper arms, jaw, or neck. Become weak or dizzy. Feel faint. Have palpitations that do not go away.

## 2022-06-20 NOTE — ED Provider Notes (Signed)
[facility]-EMERGENCY DEPT Provider Note   CSN: [id number] Arrival date & time: 06/19/22  2123     History  Chief Complaint  Patient presents with   Dizziness   Tachycardia    Tristan Perez is a 51 y.o. male who presents emergency department with chief complaint of tachycardia and dizziness.  He has a past medical history of ADHD, obesity, OSA, newly diagnosed with diabetes patient states that he had onset of heart racing with associated feeling of "swaying" when he is standing.  Patient was admitted for persistent tachycardia 2 years ago and was noted to have a pericardial effusion and vertigo.  He was evaluated by cardiology and started on metoprolol.  Patient takes 25 mg of metoprolol twice a day but did not take it last night due to coming to the emergency department for evaluation.  He denies chest pain, shortness of breath, feelings of presyncope, ataxia or imbalance.   Dizziness      Home Medications Prior to Admission medications   Medication Sig Start Date End Date Taking? Authorizing Provider  amLODipine (NORVASC) 2.5 MG tablet Take 1 tablet (2.5 mg total) by mouth daily. 10/15/20  Yes Alwyn Ren, MD  metFORMIN (GLUCOPHAGE) 500 MG tablet Take 500 mg by mouth 2 (two) times daily. 05/26/22  Yes [provider]  metoprolol tartrate (LOPRESSOR) 25 MG tablet Take 1 tablet (25 mg total) by mouth 2 (two) times daily. 10/14/20 06/20/22 Yes Lars Masson, MD  meclizine (ANTIVERT) 25 MG tablet Take 1 tablet (25 mg total) by mouth 3 (three) times daily. Patient not taking: Reported on 06/20/2022 10/14/20   Alwyn Ren, MD  meloxicam (MOBIC) 15 MG tablet Take 15 mg by mouth daily as needed. Patient not taking: Reported on 06/20/2022 05/25/22   [provider]  predniSONE (DELTASONE) 10 MG tablet Take 2 tablets (20 mg total) by mouth 2 (two) times daily. Patient not taking: Reported on 06/20/2022 04/28/21   Geoffery Lyons, MD       Allergies    Patient has no known allergies.    Review of Systems   Review of Systems  Neurological:  Positive for dizziness.    Physical Exam Updated Vital Signs BP (!) 168/83   Pulse 84   Temp 98.4 F (36.9 C) (Oral)   Resp (!) 21   Ht 6' (1.829 m)   Wt (!) 190.5 kg   SpO2 96%   BMI 56.96 kg/m  Physical Exam Vitals and nursing note reviewed.  Constitutional:      General: He is not in acute distress.    Appearance: He is well-developed. He is not diaphoretic.  HENT:     Head: Normocephalic and atraumatic.  Eyes:     General: No scleral icterus.    Extraocular Movements: Extraocular movements intact.     Right eye: No nystagmus.     Left eye: No nystagmus.     Conjunctiva/sclera: Conjunctivae normal.  Cardiovascular:     Rate and Rhythm: Regular rhythm. Tachycardia present.     Heart sounds: Normal heart sounds.  Pulmonary:     Effort: Pulmonary effort is normal. No respiratory distress.     Breath sounds: Normal breath sounds.  Abdominal:     Palpations: Abdomen is soft.     Tenderness: There is no abdominal tenderness.  Musculoskeletal:     Cervical back: Normal range of motion and neck supple.  Skin:    General: Skin is warm and dry.  Neurological:  General: No focal deficit present.     Mental Status: He is alert and oriented to person, place, and time.     Cranial Nerves: No cranial nerve deficit.     Sensory: No sensory deficit.     Motor: No weakness.     Coordination: Coordination normal.     Gait: Gait normal.     Deep Tendon Reflexes: Reflexes normal.     Comments: Speech is clear and goal oriented, follows commands Major Cranial nerves without deficit, no facial droop Normal strength in upper and lower extremities bilaterally including dorsiflexion and plantar flexion, strong and equal grip strength Sensation normal to light and sharp touch Moves extremities without ataxia, coordination intact Normal finger to nose and rapid alternating  movements  no pronator drift   Psychiatric:        Behavior: Behavior normal.     ED Results / Procedures / Treatments   Labs (all labs ordered are listed, but only abnormal results are displayed) Labs Reviewed  URINALYSIS, ROUTINE W REFLEX MICROSCOPIC - Abnormal; Notable for the following components:      Result Value   APPearance HAZY (*)    Hgb urine dipstick LARGE (*)    Protein, ur 30 (*)    RBC / HPF >50 (*)    Bacteria, UA RARE (*)    All other components within normal limits  CBC WITH DIFFERENTIAL/PLATELET - Abnormal; Notable for the following components:   Abs Immature Granulocytes 0.10 (*)    All other components within normal limits  COMPREHENSIVE METABOLIC PANEL - Abnormal; Notable for the following components:   Glucose, Bld 102 (*)    All other components within normal limits  CBG MONITORING, ED - Abnormal; Notable for the following components:   Glucose-Capillary 112 (*)    All other components within normal limits  TROPONIN I (HIGH SENSITIVITY) - Abnormal; Notable for the following components:   Troponin I (High Sensitivity) 21 (*)    All other components within normal limits  TROPONIN I (HIGH SENSITIVITY) - Abnormal; Notable for the following components:   Troponin I (High Sensitivity) 23 (*)    All other components within normal limits  TSH  D-DIMER, QUANTITATIVE (NOT AT [facility])    EKG EKG Interpretation  Date/Time:  Friday June 19 2022 21:48:57 EDT Ventricular Rate:  126 PR Interval:  147 QRS Duration: 89 QT Interval:  309 QTC Calculation: 448 R Axis:   249 Text Interpretation: Sinus tachycardia Markedly posterior QRS axis Consider anterior infarct Confirmed by Gilda Crease [phone]) on 06/20/2022 4:11:26 AM  Radiology No results found.  Procedures Procedures    Medications Ordered in ED Medications  metoprolol tartrate (LOPRESSOR) tablet 25 mg (25 mg Oral Given 06/20/22 0247)    ED Course/ Medical Decision Making/ A&P                            Medical Decision Making Patient presents to the ED with Sinus tachycardia and "swaying sensation." He had the same episode 2 years ago. The differential diagnosis of sinus tach includes but is not limited to Pain/ Anger/Anxiety, Fever, SIRS/ Sepsis, Anemia, Drug/Etoh intoxication, Drug/ ETOH withdrawal, AGMA, Hyperthyroidism, PE, CHF, Tamponade, Cardiac contusion, valvular disease, hyper/hypoglycemia, MI, Pheochromocytoma,  After review of all data points pt does not appear to be having ACS, PE, Thyroid storm. His high sensitivity trop is only slightly elevated and is essentially flat. I suspect demand ischemia.   EKG shows  sinus tachycardia.  Patient given his normal dose of metoprolol and fluids with resolution of his tachycardia.  I considered admission for further evaluation and monitoring, however after discussion with Dr. Okey Dupre of Cardiology and with resolution of tachycardia, flat troponins, and previous admission for same, we feel the patient is safe for discharge with close OP cardiology follow up.  Pt given amblatory referral to Cardiology  Strict return precautions given     Amount and/or Complexity of Data Reviewed Labs: ordered. Discussion of management or test interpretation with external provider(s): Discussed with Dr. Okey Dupre of cardiology.  He feels that he may be managed as an outpatient with cardiac monitoring.  Risk Prescription drug management.           Final Clinical Impression(s) / ED Diagnoses Final diagnoses:  Sinus tachycardia    Rx / DC Orders ED Discharge Orders          Ordered    Ambulatory referral to Cardiology       Comments: If you have not heard from the Cardiology office within the next 72 hours please call [phone].   06/20/22 0619              Arthor Captain, PA-C 06/20/22 1616    Pollina, Canary Brim, MD 06/21/22 [phone]

## 2022-06-24 ENCOUNTER — Ambulatory Visit: Payer: 59 | Admitting: Physical Therapy

## 2022-06-26 ENCOUNTER — Ambulatory Visit: Payer: No Typology Code available for payment source | Admitting: Physical Therapy

## 2022-06-26 ENCOUNTER — Ambulatory Visit: Payer: 59 | Attending: Internal Medicine | Admitting: Internal Medicine

## 2022-06-26 VITALS — BP 140/78 | HR 91 | Ht 72.0 in | Wt >= 6400 oz

## 2022-06-26 DIAGNOSIS — R000 Tachycardia, unspecified: Secondary | ICD-10-CM

## 2022-06-26 NOTE — Progress Notes (Signed)
Cardiology Office Note:    Date:  06/26/2022   ID:  Tristan Perez, DOB 06/30/1971, MRN [id number]  PCP:  Farris Has, MD   [facility] Providers Cardiologist:  None      Referring MD: Farris Has, MD   No chief complaint on file. Sinus tachycardia  History of Present Illness:    Tristan Perez is a 51 y.o. male with a hx of HTN, ADHD, obesity OSA, referral from the ED for tachycardia.  He came to the ED for a swaying sensation. Today he is asymptomatic.  His heart rate is improved. He has vertigo. No syncope.  Echo 2021, showed normal LV/RV function, no valve dx.  Past Medical History:  Diagnosis Date   ADHD    Constipation    AND ALTERNATES WITH DIARRHEA AND SOME BLOOD SINCE END OF JUNE 2020   Eczema    Fatty liver    High blood pressure    History of kidney stones    History of urinary tract infection    Hypertension    Pt states was on beta blocker 10 years ago but took only for 6 months    Kidney stones    Obesity    Palpitations    Prediabetes     Past Surgical History:  Procedure Laterality Date   COLONOSCOPY WITH PROPOFOL N/A 06/22/2019   Procedure: COLONOSCOPY WITH PROPOFOL;  Surgeon: Carman Ching, MD;  Location: [facility];  Service: Endoscopy;  Laterality: N/A;   CYSTOSCOPY/URETEROSCOPY/HOLMIUM LASER/STENT PLACEMENT Left 09/14/2016   Procedure: CYSTOSCOPY/RETROGRADES/URETEROSCOPY/HOLMIUM LASER/STENT PLACEMENT;  Surgeon: Heloise Purpura, MD;  Location: [facility];  Service: Urology;  Laterality: Left;   extraction of wisdom teeth      Current Medications: No outpatient medications have been marked as taking for the 06/26/22 encounter (Appointment) with Maisie Fus, MD.     Allergies:   Patient has no known allergies.   Social History   Socioeconomic History   Marital status: Single    Spouse name: Not on file   Number of children: 1   Years of education: Not on file   Highest education level: Not on file  Occupational History    Not on file  Tobacco Use   Smoking status: Never   Smokeless tobacco: Never  Vaping Use   Vaping Use: Never used  Substance and Sexual Activity   Alcohol use: No   Drug use: No   Sexual activity: Not on file  Other Topics Concern   Not on file  Social History Narrative   Not on file   Social Determinants of Health   Financial Resource Strain: Not on file  Food Insecurity: Not on file  Transportation Needs: Not on file  Physical Activity: Not on file  Stress: Not on file  Social Connections: Not on file     Family History: The patient's family history includes Diabetes in his father; Hyperlipidemia in his mother; Hypertension in his father; Obesity in his father; Stroke in an other family member.  ROS:   Please see the history of present illness.     All other systems reviewed and are negative.  EKGs/Labs/Other Studies Reviewed:    The following studies were reviewed today:   Recent Labs: 06/19/2022: ALT 33; BUN 15; Creatinine, Ser 0.82; Hemoglobin 14.4; Platelets 327; Potassium 3.8; Sodium 138 06/20/2022: TSH 3.007  Recent Lipid Panel    Component Value Date/Time   CHOL 171 10/02/2008 1152   TRIG 82 10/02/2008 1152   HDL 26.3 (  L) 10/02/2008 1152   CHOLHDL 6.5 CALC 10/02/2008 1152   VLDL 16 10/02/2008 1152   LDLCALC 128 (H) 10/02/2008 1152     Risk Assessment/Calculations:     Physical Exam:    VS:    Wt Readings from Last 3 Encounters:  06/19/22 (!) 420 lb (190.5 kg)  07/15/21 (!) 429 lb (194.6 kg)  04/28/21 (!) 427 lb (193.7 kg)     GEN: morbidly obese, BMI 56, Well nourished, well developed in no acute distress HEENT: Normal NECK: No JVD; No carotid bruits LYMPHATICS: No lymphadenopathy CARDIAC: RRR, no murmurs, rubs, gallops RESPIRATORY:  Clear to auscultation without rales, wheezing or rhonchi  ABDOMEN: Soft, non-tender, non-distended MUSCULOSKELETAL:  No edema; No deformity  SKIN: Warm and dry NEUROLOGIC:  Alert and oriented x  3 PSYCHIATRIC:  Normal affect   ASSESSMENT:   Sinus tachycardia : noted some anxiety prior to his visit. This can contribute. He does not have high risk features including syncope c/f arrhythmia , family hx of SCD, or abnormalities on her EKGs.   We discussed stress relief and self care. He is working with weight management via his PCP     PLAN:    In order of problems listed above:  Follow up as needed           Medication Adjustments/Labs and Tests Ordered: Current medicines are reviewed at length with the patient today.  Concerns regarding medicines are outlined above.  No orders of the defined types were placed in this encounter.  No orders of the defined types were placed in this encounter.   There are no Patient Instructions on file for this visit.   Signed, Maisie Fus, MD  06/26/2022 10:54 AM    [facility]

## 2022-06-26 NOTE — Patient Instructions (Signed)
Medication Instructions:  Your physician recommends that you continue on your current medications as directed. Please refer to the Current Medication list given to you today.    Lab Work: None   Testing/Procedures: None   Follow-Up: At [facility], you and your health needs are our priority.  As part of our continuing mission to provide you with exceptional heart care, we have created designated Provider Care Teams.  These Care Teams include your primary Cardiologist (physician) and Advanced Practice Providers (APPs -  Physician Assistants and Nurse Practitioners) who all work together to provide you with the care you need, when you need it.  We recommend signing up for the patient portal called "MyChart".  Sign up information is provided on this After Visit Summary.  MyChart is used to connect with patients for Virtual Visits (Telemedicine).  Patients are able to view lab/test results, encounter notes, upcoming appointments, etc.  Non-urgent messages can be sent to your provider as well.   To learn more about what you can do with MyChart, go to [email or web address].    Your next appointment:   As needed  The format for your next appointment:   In Person  Provider:   Branch, Mary E, MD     Other Instructions   Important Information About Sugar       

## 2022-06-30 ENCOUNTER — Encounter (HOSPITAL_BASED_OUTPATIENT_CLINIC_OR_DEPARTMENT_OTHER): Payer: Self-pay | Admitting: Pediatrics

## 2022-06-30 ENCOUNTER — Other Ambulatory Visit: Payer: Self-pay

## 2022-06-30 ENCOUNTER — Emergency Department (HOSPITAL_BASED_OUTPATIENT_CLINIC_OR_DEPARTMENT_OTHER)
Admission: EM | Admit: 2022-06-30 | Discharge: 2022-06-30 | Disposition: A | Discharge status: Home / Self Care | Payer: No Typology Code available for payment source | Attending: Emergency Medicine | Admitting: Emergency Medicine

## 2022-06-30 DIAGNOSIS — R42 Dizziness and giddiness: Secondary | ICD-10-CM | POA: Diagnosis not present

## 2022-06-30 LAB — CBC
HCT: 45.2 % (ref 39.0–52.0)
Hemoglobin: 14.8 g/dL (ref 13.0–17.0)
MCH: 27.9 pg (ref 26.0–34.0)
MCHC: 32.7 g/dL (ref 30.0–36.0)
MCV: 85.3 fL (ref 80.0–100.0)
Platelets: 371 10*3/uL (ref 150–400)
RBC: 5.3 MIL/uL (ref 4.22–5.81)
RDW: 14.6 % (ref 11.5–15.5)
WBC: 10.4 10*3/uL (ref 4.0–10.5)
nRBC: 0 % (ref 0.0–0.2)

## 2022-06-30 LAB — BASIC METABOLIC PANEL
Anion gap: 7 (ref 5–15)
BUN: 14 mg/dL (ref 6–20)
CO2: 23 mmol/L (ref 22–32)
Calcium: 8.9 mg/dL (ref 8.9–10.3)
Chloride: 105 mmol/L (ref 98–111)
Creatinine, Ser: 0.89 mg/dL (ref 0.61–1.24)
GFR, Estimated: >60 mL/min (ref >60)
Glucose, Bld: 130 mg/dL — ABNORMAL HIGH (ref 70–99)
Potassium: 4.1 mmol/L (ref 3.5–5.1)
Sodium: 135 mmol/L (ref 135–145)

## 2022-06-30 LAB — CBG MONITORING, ED: Glucose-Capillary: 120 mg/dL — ABNORMAL HIGH (ref 70–99)

## 2022-06-30 MED ORDER — MECLIZINE HCL 25 MG PO TABS: 25.0000 mg | ORAL_TABLET | Freq: Three times a day (TID) | ORAL | 0 refills | Status: DC | PRN

## 2022-06-30 NOTE — ED Triage Notes (Signed)
C/O dizziness and vertigo the last couple of days;

## 2022-06-30 NOTE — ED Provider Notes (Signed)
[facility] EMERGENCY DEPARTMENT Provider Note   CSN: [id number] Arrival date & time: 06/30/22  1743     History  Chief Complaint  Patient presents with   Dizziness    Tristan Perez is a 51 y.o. male.  Patient here with intermittent dizziness.  History of vertigo.  Denies any weakness, numbness, headache, neck pain, chills, fever.  He feels that with movement it is worse but when he is lying still it is better.  No ringing in his ear or loss of vision or difficulty with speech.  He is fairly asymptomatic at this time.  He has noticed that sometimes his heart rate goes up when he ambulates.  Denies any chest pain or shortness of breath.  No nausea, vomiting at this time.  The history is provided by the patient.       Home Medications Prior to Admission medications   Medication Sig Start Date End Date Taking? Authorizing Provider  meclizine (ANTIVERT) 25 MG tablet Take 1 tablet (25 mg total) by mouth 3 (three) times daily as needed for dizziness. 06/30/22  Yes Sebrena Engh, DO  amLODipine (NORVASC) 2.5 MG tablet Take 1 tablet (2.5 mg total) by mouth daily. 10/15/20   Alwyn Ren, MD  meloxicam (MOBIC) 15 MG tablet Take 15 mg by mouth daily as needed. 05/25/22   [provider]  metFORMIN (GLUCOPHAGE) 500 MG tablet Take 500 mg by mouth 2 (two) times daily. 05/26/22   [provider]  metoprolol tartrate (LOPRESSOR) 25 MG tablet Take 1 tablet (25 mg total) by mouth 2 (two) times daily. 10/14/20 06/26/22  Lars Masson, MD  Vitamin D, Ergocalciferol, (DRISDOL) 1.25 MG (50000 UNIT) CAPS capsule Take 50,000 Units by mouth once a week. 06/25/22   [provider]      Allergies    Patient has no known allergies.    Review of Systems   Review of Systems  Physical Exam Updated Vital Signs BP 122/66   Pulse 92   Temp 98.5 F (36.9 C) (Oral)   Resp 20   Ht 6' (1.829 m)   Wt (!) 200.9 kg   SpO2 97%   BMI 60.08 kg/m  Physical  Exam Vitals and nursing note reviewed.  Constitutional:      General: He is not in acute distress.    Appearance: He is well-developed. He is not ill-appearing.  HENT:     Head: Normocephalic and atraumatic.     Nose: Nose normal.     Mouth/Throat:     Mouth: Mucous membranes are moist.  Eyes:     Extraocular Movements: Extraocular movements intact.     Conjunctiva/sclera: Conjunctivae normal.     Pupils: Pupils are equal, round, and reactive to light.  Cardiovascular:     Rate and Rhythm: Normal rate and regular rhythm.     Pulses: Normal pulses.     Heart sounds: Normal heart sounds. No murmur heard. Pulmonary:     Effort: Pulmonary effort is normal. No respiratory distress.     Breath sounds: Normal breath sounds.  Abdominal:     Palpations: Abdomen is soft.     Tenderness: There is no abdominal tenderness.  Musculoskeletal:        General: No swelling.     Cervical back: Normal range of motion and neck supple.  Skin:    General: Skin is warm and dry.     Capillary Refill: Capillary refill takes less than 2 seconds.  Neurological:  General: No focal deficit present.     Mental Status: He is alert and oriented to person, place, and time.     Cranial Nerves: No cranial nerve deficit.     Sensory: No sensory deficit.     Coordination: Coordination normal.     Comments: 5+ out of 5 strength throughout, normal sensation, no drift, normal speech, normal finger-nose-finger, no nystagmus.  Psychiatric:        Mood and Affect: Mood normal.     ED Results / Procedures / Treatments   Labs (all labs ordered are listed, but only abnormal results are displayed) Labs Reviewed  BASIC METABOLIC PANEL - Abnormal; Notable for the following components:      Result Value   Glucose, Bld 130 (*)    All other components within normal limits  CBG MONITORING, ED - Abnormal; Notable for the following components:   Glucose-Capillary 120 (*)    All other components within normal limits   CBC    EKG EKG Interpretation  Date/Time:  Tuesday June 30 2022 17:52:42 EDT Ventricular Rate:  108 PR Interval:  144 QRS Duration: 74 QT Interval:  324 QTC Calculation: 434 R Axis:   0 Text Interpretation: Sinus tachycardia Cannot rule out Anterior infarct , age undetermined Abnormal ECG When compared with ECG of 19-Jun-2022 21:48, PREVIOUS ECG IS PRESENT Confirmed by Virgina Norfolk ([id number]) on 06/30/2022 6:31:42 PM  Radiology No results found.  Procedures Procedures    Medications Ordered in ED Medications - No data to display  ED Course/ Medical Decision Making/ A&P                           Medical Decision Making Amount and/or Complexity of Data Reviewed Labs: ordered.   TRACKER MANCE is here with dizziness.  Normal vitals.  No fever.  EKG shows sinus rhythm.  No ischemic changes.  Mildly tachycardic.  On my evaluation heart rates in the 80s.  He is neurologically intact.  Has history of vertigo.  No other significant medical history.  Denies any chest pain or shortness of breath.  No signs of volume overload on exam.  Well-appearing.  Clear breath sounds.  He is asymptomatic at rest.  But when he stands up or moves he feels somewhat dizzy.  He has been ambulatory.  He has had a history of vertigo and use meclizine in the past.  I have no concern for stroke.  This appears to be consistent with vertigo/peripheral vertigo type process.  Basic labs have been drawn prior to my evaluation with CBC and BMP and per my review and interpretation these are unremarkable.  He has normal vitals.  No fever.  Doubt infectious process.  Overall I have no concern for stroke or other acute neurologic process.  Will prescribe meclizine and have him follow-up with ENT.  Instructed him on how to use the Epley maneuver.  Discharged in good condition.  This chart was dictated using voice recognition software.  Despite best efforts to proofread,  errors can occur which can change the  documentation meaning.         Final Clinical Impression(s) / ED Diagnoses Final diagnoses:  Dizziness    Rx / DC Orders ED Discharge Orders          Ordered    meclizine (ANTIVERT) 25 MG tablet  3 times daily PRN        06/30/22 1910  Virgina Norfolk, DO 06/30/22 1916

## 2022-06-30 NOTE — Discharge Instructions (Addendum)
Follow-up with ENT.  Take meclizine as prescribed to help with what I suspect is vertigo.  I have given you information about the Epley maneuver.

## 2022-07-01 ENCOUNTER — Ambulatory Visit: Payer: No Typology Code available for payment source | Admitting: Physical Therapy

## 2022-07-03 ENCOUNTER — Ambulatory Visit: Payer: No Typology Code available for payment source | Attending: Sports Medicine | Admitting: Physical Therapy

## 2022-07-06 ENCOUNTER — Ambulatory Visit: Payer: No Typology Code available for payment source | Admitting: Physical Therapy

## 2022-07-06 ENCOUNTER — Ambulatory Visit
Admission: RE | Admit: 2022-07-06 | Discharge: 2022-07-06 | Disposition: A | Discharge status: Home / Self Care | Payer: No Typology Code available for payment source | Source: Ambulatory Visit | Attending: Emergency Medicine | Admitting: Emergency Medicine

## 2022-07-06 VITALS — BP 141/88 | HR 98 | Temp 98.6°F | Resp 18

## 2022-07-06 DIAGNOSIS — H9311 Tinnitus, right ear: Secondary | ICD-10-CM | POA: Diagnosis not present

## 2022-07-06 DIAGNOSIS — H81393 Other peripheral vertigo, bilateral: Secondary | ICD-10-CM | POA: Diagnosis not present

## 2022-07-06 MED ORDER — MECLIZINE HCL 25 MG PO TABS: 25.0000 mg | ORAL_TABLET | Freq: Three times a day (TID) | ORAL | 0 refills | Status: AC | PRN

## 2022-07-06 NOTE — Discharge Instructions (Addendum)
Continue doing the Epley maneuver, on the worse side.  Take the meclizine as needed.  Please follow-up with ENT ASAP.

## 2022-07-06 NOTE — ED Triage Notes (Signed)
Pt here with ringing and fullness in right ear and episodes of dizziness x 3 weeks.

## 2022-07-06 NOTE — ED Provider Notes (Signed)
HPI  SUBJECTIVE:  Tristan Perez is a 51 y.o. male who presents with intermittent vertigo for the past 3 weeks described as "swaying on a boat".  He reports a headache if he squints for long time only.  There are no other headaches.  He states that the vertigo is present when he walks.  It can last for up to 10 minutes.  No chest pain, shortness of breath, palpitations, slurred speech, arm or leg weakness, syncope.  He reports ear fullness. no change in hearing, ear pain, hearing loss.  He states this is similar to previous episodes of vertigo in the past.  He also reports constant, nonpulsatile right ear tinnitus during this time.  He has tried meclizine and the Epley maneuver with improvement in his symptoms.  Sitting still also helps.  Symptoms are worse with walking, turning his head to the right.  No recent change in his medications.  He had vertigo 2 years ago with palpitations, was admitted, and cardiac work-up was negative.  He states that the vertigo resolved with exercises.  He has a past medical history of diabetes, states his glucose has been running within normal limits, hypertension.  No history of Mnire's, atrial fibrillation, stroke.  PCP: Deboraha Sprang.  ENT: [facility].   Patient was seen in the ER for dizziness last week on 9/5.  EKG showed sinus tachycardia, but was otherwise normal, BMP was normal.  He was thought to have a peripheral vertigo, was sent home with meclizine, the Epley maneuver, and was advised to follow-up with ENT.  Past Medical History:  Diagnosis Date   ADHD    Constipation    AND ALTERNATES WITH DIARRHEA AND SOME BLOOD SINCE END OF JUNE 2020   Eczema    Fatty liver    High blood pressure    History of kidney stones    History of urinary tract infection    Hypertension    Pt states was on beta blocker 10 years ago but took only for 6 months    Kidney stones    Obesity    Palpitations    Prediabetes     Past Surgical History:  Procedure Laterality  Date   COLONOSCOPY WITH PROPOFOL N/A 06/22/2019   Procedure: COLONOSCOPY WITH PROPOFOL;  Surgeon: Carman Ching, MD;  Location: [facility];  Service: Endoscopy;  Laterality: N/A;   CYSTOSCOPY/URETEROSCOPY/HOLMIUM LASER/STENT PLACEMENT Left 09/14/2016   Procedure: CYSTOSCOPY/RETROGRADES/URETEROSCOPY/HOLMIUM LASER/STENT PLACEMENT;  Surgeon: Heloise Purpura, MD;  Location: [facility];  Service: Urology;  Laterality: Left;   extraction of wisdom teeth      Family History  Problem Relation Age of Onset   Diabetes Father    Hypertension Father    Obesity Father    Stroke Other    Hyperlipidemia Mother     Social History   Tobacco Use   Smoking status: Never   Smokeless tobacco: Never  Vaping Use   Vaping Use: Never used  Substance Use Topics   Alcohol use: No   Drug use: No    No current facility-administered medications for this encounter.  Current Outpatient Medications:    amLODipine (NORVASC) 2.5 MG tablet, Take 1 tablet (2.5 mg total) by mouth daily., Disp: 30 tablet, Rfl: 1   meclizine (ANTIVERT) 25 MG tablet, Take 1 tablet (25 mg total) by mouth 3 (three) times daily as needed for dizziness., Disp: 30 tablet, Rfl: 0   meloxicam (MOBIC) 15 MG tablet, Take 15 mg by mouth daily as needed., Disp: , Rfl:  metFORMIN (GLUCOPHAGE) 500 MG tablet, Take 500 mg by mouth 2 (two) times daily., Disp: , Rfl:    metoprolol tartrate (LOPRESSOR) 25 MG tablet, Take 1 tablet (25 mg total) by mouth 2 (two) times daily., Disp: 60 tablet, Rfl: 11   Vitamin D, Ergocalciferol, (DRISDOL) 1.25 MG (50000 UNIT) CAPS capsule, Take 50,000 Units by mouth once a week., Disp: , Rfl:   No Known Allergies   ROS  As noted in HPI.   Physical Exam  BP (!) 141/88   Pulse 98   Temp 98.6 F (37 C)   Resp 18   SpO2 98%   Constitutional: Well developed, well nourished, no acute distress Eyes: PERRL, EOMI, conjunctiva normal bilaterally HENT: Normocephalic, atraumatic,mucus membranes moist.  Small amount  of wax in each ear canal, but able to fully visualize both TMs.  Both TMs normal.  No erythema, fluid behind the ears.  Hearing intact and equal bilaterally. Respiratory: Clear to auscultation bilaterally, no rales, no wheezing, no rhonchi Cardiovascular: Normal rate and rhythm, no murmurs, no gallops, no rubs, no carotid bruit skin: No rash, skin intact Musculoskeletal: Moving all extremities equally Neurologic: Alert & oriented x 3, CN III-XII intact.  No motor deficits, sensation grossly intact.  Finger-nose, heel shin within normal limits.  Tandem gait steady.  Romberg negative.  Positive Dix-Hallpike, worse on the left. Psychiatric: Speech and behavior appropriate   ED Course   Medications - No data to display  No orders of the defined types were placed in this encounter.  No results found for this or any previous visit (from the past 24 hour(s)). No results found.  ED Clinical Impression  1. Peripheral vertigo of both ears   2. Tinnitus of right ear      ED Assessment/Plan      This does not appear to be a TIA or CVA.  He is completely neurologically intact.  He has had a cardiac work-up for this in the past 2 years which was negative.  He states that the meclizine is helping him.  This is more consistent with a peripheral vertigo, rather than a central cause.  He may have Mnire's disease, but he does not quite meet the diagnostic criteria as his vertigo lasts less than 20 minutes.  No evidence of ear infection, effusion.  ER records reviewed.  As noted in HPI.   We will extend his course of meclizine which helps, have him continue the Epley maneuver, contact his ENT for follow-up ASAP.  Discussed  MDM, treatment plan, and plan for follow-up with patient Discussed sn/sx that should prompt return to the ED. patient agrees with plan.   Meds ordered this encounter  Medications   meclizine (ANTIVERT) 25 MG tablet    Sig: Take 1 tablet (25 mg total) by mouth 3 (three)  times daily as needed for dizziness.    Dispense:  30 tablet    Refill:  0      *This clinic note was created using Scientist, clinical (histocompatibility and immunogenetics). Therefore, there may be occasional mistakes despite careful proofreading. ?    Domenick Gong, MD 07/07/22 [phone]

## 2022-07-08 ENCOUNTER — Ambulatory Visit: Payer: No Typology Code available for payment source | Admitting: Physical Therapy

## 2022-07-13 ENCOUNTER — Ambulatory Visit: Payer: No Typology Code available for payment source | Admitting: Physical Therapy

## 2022-07-15 ENCOUNTER — Ambulatory Visit: Payer: No Typology Code available for payment source | Admitting: Physical Therapy

## 2022-10-18 ENCOUNTER — Emergency Department (HOSPITAL_BASED_OUTPATIENT_CLINIC_OR_DEPARTMENT_OTHER)
Admission: EM | Admit: 2022-10-18 | Discharge: 2022-10-18 | Disposition: A | Discharge status: Home / Self Care | Payer: No Typology Code available for payment source | Attending: Emergency Medicine | Admitting: Emergency Medicine

## 2022-10-18 ENCOUNTER — Encounter (HOSPITAL_BASED_OUTPATIENT_CLINIC_OR_DEPARTMENT_OTHER): Payer: Self-pay | Admitting: Emergency Medicine

## 2022-10-18 ENCOUNTER — Emergency Department (HOSPITAL_BASED_OUTPATIENT_CLINIC_OR_DEPARTMENT_OTHER): Payer: No Typology Code available for payment source

## 2022-10-18 ENCOUNTER — Other Ambulatory Visit: Payer: Self-pay

## 2022-10-18 DIAGNOSIS — R42 Dizziness and giddiness: Secondary | ICD-10-CM

## 2022-10-18 DIAGNOSIS — Z79899 Other long term (current) drug therapy: Secondary | ICD-10-CM | POA: Insufficient documentation

## 2022-10-18 DIAGNOSIS — I10 Essential (primary) hypertension: Secondary | ICD-10-CM | POA: Diagnosis not present

## 2022-10-18 LAB — CBC WITH DIFFERENTIAL/PLATELET
Abs Immature Granulocytes: 0.11 10*3/uL — ABNORMAL HIGH (ref 0.00–0.07)
Basophils Absolute: 0.1 10*3/uL (ref 0.0–0.1)
Basophils Relative: 1 %
Eosinophils Absolute: 0.1 10*3/uL (ref 0.0–0.5)
Eosinophils Relative: 1 %
HCT: 44.8 % (ref 39.0–52.0)
Hemoglobin: 14.5 g/dL (ref 13.0–17.0)
Immature Granulocytes: 1 %
Lymphocytes Relative: 22 %
Lymphs Abs: 2.4 10*3/uL (ref 0.7–4.0)
MCH: 27.8 pg (ref 26.0–34.0)
MCHC: 32.4 g/dL (ref 30.0–36.0)
MCV: 86 fL (ref 80.0–100.0)
Monocytes Absolute: 0.6 10*3/uL (ref 0.1–1.0)
Monocytes Relative: 6 %
Neutro Abs: 7.6 10*3/uL (ref 1.7–7.7)
Neutrophils Relative %: 69 %
Platelets: 377 10*3/uL (ref 150–400)
RBC: 5.21 MIL/uL (ref 4.22–5.81)
RDW: 13.9 % (ref 11.5–15.5)
WBC: 11 10*3/uL — ABNORMAL HIGH (ref 4.0–10.5)
nRBC: 0 % (ref 0.0–0.2)

## 2022-10-18 LAB — BASIC METABOLIC PANEL
Anion gap: 6 (ref 5–15)
BUN: 17 mg/dL (ref 6–20)
CO2: 26 mmol/L (ref 22–32)
Calcium: 9.4 mg/dL (ref 8.9–10.3)
Chloride: 105 mmol/L (ref 98–111)
Creatinine, Ser: 0.77 mg/dL (ref 0.61–1.24)
GFR, Estimated: >60 mL/min (ref >60)
Glucose, Bld: 119 mg/dL — ABNORMAL HIGH (ref 70–99)
Potassium: 3.9 mmol/L (ref 3.5–5.1)
Sodium: 137 mmol/L (ref 135–145)

## 2022-10-18 MED ORDER — SODIUM CHLORIDE 0.9 % IV BOLUS
1000.0000 mL | Freq: Once | INTRAVENOUS | Status: AC
  Administered 2022-10-18: 1000 mL via INTRAVENOUS

## 2022-10-18 MED ORDER — METOCLOPRAMIDE HCL 5 MG/ML IJ SOLN
10.0000 mg | Freq: Once | INTRAMUSCULAR | Status: AC
  Administered 2022-10-18: 10 mg via INTRAVENOUS
  Filled 2022-10-18: qty 2

## 2022-10-18 MED ORDER — IOHEXOL 350 MG/ML SOLN
100.0000 mL | Freq: Once | INTRAVENOUS | Status: AC | PRN
Start: 2022-10-18 — End: 2022-10-18
  Administered 2022-10-18: 75 mL via INTRAVENOUS

## 2022-10-18 NOTE — Discharge Instructions (Signed)
Note the workup today was overall reassuring.  Laboratory and imaging studies were negative for any acute abnormalities.  Recommend getting up slowly from a seated position to avoid symptoms of dizziness.  Keep your upcoming appointment with cardiologist.  I will attach information to your discharge papers for neurology to set up appointment with as well.  Is important to have the specialist on board to determine source of feelings of dizziness.  Make sure to maintain proper oral hydration as well as nutrition to help avoid increase likelihood of events.  Please not hesitate to return to emergency department if the worrisome signs and symptoms we discussed become apparent.

## 2022-10-18 NOTE — ED Notes (Signed)
IV attempt x2 unsuccessful right AC.

## 2022-10-18 NOTE — ED Triage Notes (Signed)
Pt has intermittent dizziness since August. Pt seen by ENT provider. Dizziness returned on Thursday relieved when he lays down.

## 2022-10-18 NOTE — ED Provider Notes (Signed)
[facility] EMERGENCY DEPARTMENT Provider Note   CSN: [id number] Arrival date & time: 10/18/22  1314     History  Chief Complaint  Patient presents with   Dizziness    Tristan Perez is a 51 y.o. male.   Dizziness   51 year old male presents emergency department with complaints of dizziness.  Dizziness described as "swaying in boat" type sensation.  Patient states that symptoms been present intermittently since August 2021.  Has been following with ENT outpatient of which do not think symptoms are related to central or peripheral cause of vertigo but seem more likely cardiovascular in origin.  Patient presents today with similar symptoms.  Patient states that he was getting out of bed earlier today and noticed the same swaying in boat type sensation.  Symptom resolved when he sat back down.  He noted 1-2 additional episodes with movement of his head as well as with positional changes.  Denies fever, chills, night sweats, visual disturbance, gait abnormalities, weakness or sensory deficits in upper or lower extremities, slurred speech, facial droop, chest pain, shortness of breath, vomiting, diarrhea, abdominal pain.  Patient is taken no medication for this.  Cardiologist appointment made for early January.  Past medical history significant for hypertension, postural orthostatic tachycardic syndrome, obesity, prediabetes  Home Medications Prior to Admission medications   Medication Sig Start Date End Date Taking? Authorizing Provider  amLODipine (NORVASC) 2.5 MG tablet Take 1 tablet (2.5 mg total) by mouth daily. 10/15/20   Alwyn Ren, MD  meclizine (ANTIVERT) 25 MG tablet Take 1 tablet (25 mg total) by mouth 3 (three) times daily as needed for dizziness. 07/06/22   Domenick Gong, MD  meloxicam (MOBIC) 15 MG tablet Take 15 mg by mouth daily as needed. 05/25/22   [provider]  metFORMIN (GLUCOPHAGE) 500 MG tablet Take 500 mg by mouth 2 (two) times  daily. 05/26/22   [provider]  metoprolol tartrate (LOPRESSOR) 25 MG tablet Take 1 tablet (25 mg total) by mouth 2 (two) times daily. 10/14/20 06/26/22  Lars Masson, MD  Vitamin D, Ergocalciferol, (DRISDOL) 1.25 MG (50000 UNIT) CAPS capsule Take 50,000 Units by mouth once a week. 06/25/22   [provider]      Allergies    Patient has no known allergies.    Review of Systems   Review of Systems  Neurological:  Positive for dizziness.  All other systems reviewed and are negative.   Physical Exam Updated Vital Signs BP (!) 146/80   Pulse 95   Temp 98.1 F (36.7 C) (Oral)   Resp 16   Ht 6' (1.829 m)   Wt (!) 203.2 kg   SpO2 95%   BMI 60.76 kg/m  Physical Exam Vitals and nursing note reviewed.  Constitutional:      General: He is not in acute distress.    Appearance: He is well-developed.  HENT:     Head: Normocephalic and atraumatic.  Eyes:     Conjunctiva/sclera: Conjunctivae normal.  Cardiovascular:     Rate and Rhythm: Normal rate and regular rhythm.     Heart sounds: No murmur heard. Pulmonary:     Effort: Pulmonary effort is normal. No respiratory distress.     Breath sounds: Normal breath sounds.  Abdominal:     Palpations: Abdomen is soft.     Tenderness: There is no abdominal tenderness.  Musculoskeletal:        General: No swelling.     Cervical back: Neck supple.  Skin:    General: Skin is warm and dry.     Capillary Refill: Capillary refill takes less than 2 seconds.  Neurological:     Mental Status: He is alert.     Comments: Alert and oriented to self, place, time and event.   Speech is fluent, clear without dysarthria or dysphasia.   Strength 5/5 in upper/lower extremities   Sensation intact in upper/lower extremities   Normal gait.  Negative Romberg. No pronator drift.  Normal finger-to-nose and feet tapping.  CN I not tested  CN II grossly intact visual fields bilaterally. Did not visualize posterior eye.  CN III,  IV, VI PERRLA and EOMs intact bilaterally  CN V Intact sensation to sharp and light touch to the face  CN VII facial movements symmetric  CN VIII not tested  CN IX, X no uvula deviation, symmetric rise of soft palate  CN XI 5/5 SCM and trapezius strength bilaterally  CN XII Midline tongue protrusion, symmetric L/R movements   Head impulse test: Negative bilaterally No nystagmus appreciated  Psychiatric:        Mood and Affect: Mood normal.     ED Results / Procedures / Treatments   Labs (all labs ordered are listed, but only abnormal results are displayed) Labs Reviewed  BASIC METABOLIC PANEL - Abnormal; Notable for the following components:      Result Value   Glucose, Bld 119 (*)    All other components within normal limits  CBC WITH DIFFERENTIAL/PLATELET - Abnormal; Notable for the following components:   WBC 11.0 (*)    Abs Immature Granulocytes 0.11 (*)    All other components within normal limits    EKG EKG Interpretation  Date/Time:  Sunday October 18 2022 13:30:52 EST Ventricular Rate:  97 PR Interval:  162 QRS Duration: 84 QT Interval:  339 QTC Calculation: 431 R Axis:   -27 Text Interpretation: Sinus rhythm Borderline left axis deviation Consider anterior infarct Confirmed by Virgina Norfolk ([id number]) on 10/18/2022 4:32:12 PM  Radiology CT Angio Head W or Wo Contrast  Result Date: 10/18/2022 CLINICAL DATA:  Sudden headache EXAM: CT ANGIOGRAPHY HEAD TECHNIQUE: Multidetector CT imaging of the head was performed using the standard protocol during bolus administration of intravenous contrast. Multiplanar CT image reconstructions and MIPs were obtained to evaluate the vascular anatomy. RADIATION DOSE REDUCTION: This exam was performed according to the departmental dose-optimization program which includes automated exposure control, adjustment of the mA and/or kV according to patient size and/or use of iterative reconstruction technique. CONTRAST:  53mL OMNIPAQUE IOHEXOL  350 MG/ML SOLN COMPARISON:  Same-day noncontrast CT head FINDINGS: CTA HEAD Anterior circulation: The intracranial ICAs are patent. The bilateral MCAs are patent, without proximal stenosis or occlusion. The bilateral ACAs are patent, without proximal stenosis or occlusion. The anterior communicating artery is normal. There is no aneurysm or AVM. Posterior circulation: The left V4 segment is markedly diminutive after the PICA origin, favored developmental. The dominant right V4 segment is patent. The basilar artery is patent. The major cerebellar arteries appear patent. The bilateral PCAs are patent, without proximal stenosis or occlusion. Small left larger than right posterior communicating arteries are identified. There is no aneurysm or AVM. Venous sinuses: Patent. Anatomic variants: None. Review of the MIP images confirms the above findings. IMPRESSION: Normal intracranial vasculature.  No aneurysm identified. Electronically Signed   By: Lesia Hausen M.D.   On: 10/18/2022 18:31   CT Head Wo Contrast  Result Date: 10/18/2022 CLINICAL DATA:  Mental  status change, unknown cause EXAM: CT HEAD WITHOUT CONTRAST TECHNIQUE: Contiguous axial images were obtained from the base of the skull through the vertex without intravenous contrast. RADIATION DOSE REDUCTION: This exam was performed according to the departmental dose-optimization program which includes automated exposure control, adjustment of the mA and/or kV according to patient size and/or use of iterative reconstruction technique. COMPARISON:  None Available. FINDINGS: Brain: No evidence of acute infarction, hemorrhage, hydrocephalus, extra-axial collection or mass lesion/mass effect. Vascular: No hyperdense vessel or unexpected calcification. Skull: Normal. Negative for fracture or focal lesion. Sinuses/Orbits: 3 cm right maxillary antrum cyst or polyp. IMPRESSION: No acute intracranial process. Electronically Signed   By: Layla MawJoshua  Pleasure M.D.   On:  10/18/2022 15:55    Procedures Procedures    Medications Ordered in ED Medications  sodium chloride 0.9 % bolus 1,000 mL ( Intravenous Stopped 10/18/22 1729)  metoCLOPramide (REGLAN) injection 10 mg (10 mg Intravenous Given 10/18/22 1625)  iohexol (OMNIPAQUE) 350 MG/ML injection 100 mL (75 mLs Intravenous Contrast Given 10/18/22 1820)    ED Course/ Medical Decision Making/ A&P Clinical Course as of 10/18/22 1857  Sun Oct 18, 2022  1639 Discussed case with attending physician Dr. Lockie Molauratolo who is in agreement with treatment plan going forward. [CR]    Clinical Course User Index [CR] Peter Garterobbins, Jermar Colter A, PA                           Medical Decision Making Amount and/or Complexity of Data Reviewed Labs: ordered. Radiology: ordered.  Risk Prescription drug management.   This patient presents to the ED for concern of dizziness, this involves an extensive number of treatment options, and is a complaint that carries with it a high risk of complications and morbidity.  The differential diagnosis includes BPPV, Mnire's disease, labyrinthitis, malignancy, electrolyte abnormalities, drug withdrawal/ingestion, cardiovascular such as arrhythmia, volume depletion, carotid artery stenosis, CVA, postural orthostatic tachycardic syndrome  Co morbidities that complicate the patient evaluation  See HPI   Additional history obtained:  Additional history obtained from EMR External records from outside source obtained and reviewed including hospital records   Lab Tests:  I Ordered, and personally interpreted labs.  The pertinent results include: Mild leukocytosis of 11.  No evidence of anemia.  Platelets within range.  No electrolyte abnormalities appreciated.  No renal dysfunction.   Imaging Studies ordered:  I ordered imaging studies including CT head, CTA head and neck I independently visualized and interpreted imaging which showed  CT head: No acute abnormalities CT angio head and  neck: No acute abnormalities. I agree with the radiologist interpretation   Cardiac Monitoring: / EKG:  The patient was maintained on a cardiac monitor.  I personally viewed and interpreted the cardiac monitored which showed an underlying rhythm of: Sinus rhythm without acute ischemic changes   Consultations Obtained:  See ED course  Problem List / ED Course / Critical interventions / Medication management  Dizziness I ordered medication including 1 L normal saline, Reglan   Reevaluation of the patient after these medicines showed that the patient improved I have reviewed the patients home medicines and have made adjustments as needed   Social Determinants of Health:  Denies tobacco, licit drug use   Test / Admission - Considered:  Dizziness Vitals signs significant for hypertension with blood pressure 146/80.  Recommend follow-up with primary care regarding elevation of blood pressure.. Otherwise within normal range and stable throughout visit. Laboratory/imaging studies significant for: See  above Patient with overall reassuring workup.  Patient followed by ENT outpatient with low suspicion for peripheral or central cause of vertigo; suspicion is cardiovascular source.  Patient without acute arrhythmia or volume depletion appreciated in the emergency department.  Neurologic exam entirely benign today further workup via MRI deemed unnecessary at this time; doubt CVA.  Symptoms seem to be positional in nature but not acutely vertiginous..  Patient was CTA head and neck as well as CT head negative.  Discussed case with attending physician Dr. Lockie Mola who is in agreement with outpatient management of patient's symptoms given duration as well as relatively stable patient's status.  Recommend keeping upcoming appointment with cardiology as well as establishing care with neurology for further specialist intervention regarding patient's symptoms.  Treatment plan discussed at length with  patient and he acknowledged understanding was agreeable to said plan. Worrisome signs and symptoms were discussed with the patient, and the patient acknowledged understanding to return to the ED if noticed. Patient was stable upon discharge.          Final Clinical Impression(s) / ED Diagnoses Final diagnoses:  Dizziness    Rx / DC Orders ED Discharge Orders     None         Peter Garter, [place] 10/18/22 1857    Virgina Norfolk, DO 10/18/22 2152

## 2022-10-29 ENCOUNTER — Encounter: Payer: Self-pay | Admitting: Nurse Practitioner

## 2022-10-29 ENCOUNTER — Ambulatory Visit: Payer: No Typology Code available for payment source | Attending: Nurse Practitioner | Admitting: Nurse Practitioner

## 2022-10-29 ENCOUNTER — Ambulatory Visit: Payer: No Typology Code available for payment source

## 2022-10-29 VITALS — BP 144/82 | HR 101 | Ht 72.0 in | Wt >= 6400 oz

## 2022-10-29 DIAGNOSIS — R002 Palpitations: Secondary | ICD-10-CM | POA: Diagnosis not present

## 2022-10-29 DIAGNOSIS — I10 Essential (primary) hypertension: Secondary | ICD-10-CM | POA: Diagnosis not present

## 2022-10-29 DIAGNOSIS — R000 Tachycardia, unspecified: Secondary | ICD-10-CM | POA: Diagnosis not present

## 2022-10-29 DIAGNOSIS — R42 Dizziness and giddiness: Secondary | ICD-10-CM | POA: Diagnosis not present

## 2022-10-29 NOTE — Progress Notes (Unsigned)
Enrolled for [employer] to mail a ZIO XT long term holter monitor to the patients address on file.   Dr. Mary Branch to read. 

## 2022-10-29 NOTE — Progress Notes (Signed)
Office Visit    Patient Name: Tristan Perez Date of Encounter: 10/29/2022  Primary Care Provider:  London Pepper, MD Primary Cardiologist:  Janina Mayo, MD  Chief Complaint    52 year old male with a history of dizziness, palpitations, tachycardia, hypertension, vertigo, ADHD, obesity, and OSA who presents for follow-up related to dizziness.  Past Medical History    Past Medical History:  Diagnosis Date   ADHD    Constipation    AND ALTERNATES WITH DIARRHEA AND SOME BLOOD SINCE END OF JUNE 2020   Eczema    Fatty liver    High blood pressure    History of kidney stones    History of urinary tract infection    Hypertension    Pt states was on beta blocker 10 years ago but took only for 6 months    Kidney stones    Obesity    Palpitations    Prediabetes    Past Surgical History:  Procedure Laterality Date   COLONOSCOPY WITH PROPOFOL N/A 06/22/2019   Procedure: COLONOSCOPY WITH PROPOFOL;  Surgeon: Laurence Spates, MD;  Location: [facility];  Service: Endoscopy;  Laterality: N/A;   CYSTOSCOPY/URETEROSCOPY/HOLMIUM LASER/STENT PLACEMENT Left 09/14/2016   Procedure: CYSTOSCOPY/RETROGRADES/URETEROSCOPY/HOLMIUM LASER/STENT PLACEMENT;  Surgeon: Raynelle Bring, MD;  Location: [facility];  Service: Urology;  Laterality: Left;   extraction of wisdom teeth      Allergies  No Known Allergies  History of Present Illness    52 year old male with the above past medical history including dizziness, palpitations, tachycardia, hypertension, vertigo, ADHD, obesity, and OSA.  Echocardiogram in 2021 showed normal LV/RV function, no significant valvular disease.  He has had multiple ED visits in the setting of dizziness which he describes as a swaying sensation.  He has followed with ENT as an outpatient who reportedly does not think his symptoms are related to central peripheral vertigo but rather cardiovascular in origin.  He was last seen in the office on 06/26/2022 and was stable from a  cardiac standpoint.  He was noted to have sinus tachycardia thought to be in the setting of anxiety.  Follow-up was recommended as needed.  He presented to the ED  again on 10/18/2022 with complaints of dizziness to prior episodes.  CT of the head/CTA head were negative for acute findings.  He was discharged home in stable condition and advised to follow-up with cardiology as an outpatient.    He presents today for follow-up.  Since his last visit he has been stable from a cardiac standpoint.  He does note daily episodes of dizziness which she described as a swaying sensation as if he were in a boat.  His symptoms occur only with exertion and resolve after about 20 minutes of seated rest.  He denies any presyncope, syncope.  Denies chest pain, dyspnea, edema, PND, orthopnea, weight gain.  Home Medications    Current Outpatient Medications  Medication Sig Dispense Refill   amLODipine (NORVASC) 2.5 MG tablet Take 1 tablet (2.5 mg total) by mouth daily. 30 tablet 1   meclizine (ANTIVERT) 25 MG tablet Take 1 tablet (25 mg total) by mouth 3 (three) times daily as needed for dizziness. 30 tablet 0   meloxicam (MOBIC) 15 MG tablet Take 15 mg by mouth daily as needed.     metFORMIN (GLUCOPHAGE) 500 MG tablet Take 500 mg by mouth 2 (two) times daily.     Vitamin D, Ergocalciferol, (DRISDOL) 1.25 MG (50000 UNIT) CAPS capsule Take 50,000 Units by mouth once a  week.     metoprolol tartrate (LOPRESSOR) 25 MG tablet Take 1 tablet (25 mg total) by mouth 2 (two) times daily. 60 tablet 11   No current facility-administered medications for this visit.     Review of Systems    He denies chest pain, palpitations, dyspnea, pnd, orthopnea, n, v, syncope, edema, weight gain, or early satiety. All other systems reviewed and are otherwise negative except as noted above.   Physical Exam    VS:  BP (!) 144/82   Pulse (!) 101   Ht 6' (1.829 m)   Wt (!) 463 lb (210 kg)   SpO2 97%   BMI 62.79 kg/m  GEN: Well  nourished, well developed, in no acute distress. HEENT: normal. Neck: Supple, no JVD, carotid bruits, or masses. Cardiac: RRR, no murmurs, rubs, or gallops. No clubbing, cyanosis, edema.  Radials/DP/PT 2+ and equal bilaterally.  Respiratory:  Respirations regular and unlabored, clear to auscultation bilaterally. GI: Obese, soft, nontender, nondistended, BS + x 4. MS: no deformity or atrophy. Skin: warm and dry, no rash. Neuro:  Strength and sensation are intact. Psych: Normal affect.  Accessory Clinical Findings    ECG personally reviewed by me today - No EKG in office today.    Lab Results  Component Value Date   WBC 11.0 (H) 10/18/2022   HGB 14.5 10/18/2022   HCT 44.8 10/18/2022   MCV 86.0 10/18/2022   PLT 377 10/18/2022   Lab Results  Component Value Date   CREATININE 0.77 10/18/2022   BUN 17 10/18/2022   NA 137 10/18/2022   K 3.9 10/18/2022   CL 105 10/18/2022   CO2 26 10/18/2022   Lab Results  Component Value Date   ALT 33 06/19/2022   AST 26 06/19/2022   ALKPHOS 82 06/19/2022   BILITOT 0.5 06/19/2022   Lab Results  Component Value Date   CHOL 171 10/02/2008   HDL 26.3 (L) 10/02/2008   LDLCALC 128 (H) 10/02/2008   TRIG 82 10/02/2008   CHOLHDL 6.5 CALC 10/02/2008    Lab Results  Component Value Date   HGBA1C 5.8 (H) 07/10/2019    Assessment & Plan    1. Dizziness: Ongoing for the past year and a half.  He describes a sensation of swaying as if he were in a boat.  Denies palpitations, presyncope, syncope.  Prior echo and recent CTA head reassuring.  Evaluated by ENT who felt his symptoms were potentially cardiac in nature.  Discussed with Dr. Harl Bowie, will repeat echocardiogram and check 14-day ZIO monitor to rule out cardiogenic source.  Suspect his symptoms could be related to overall physical deconditioning, obesity, and anxiety. Doubt orthostasis, he has checked his BP when symptoms occur and states BP has been "normal."  Discussed ED precautions.   Encouraged weight loss, activity as tolerated.  2. Palpitations/Tachycardia: Denies any recent palpitations. Stable on metoprolol.   3. Hypertension: BP well controlled. Continue current antihypertensive regimen.   4. Obesity: He is getting ready to start Ozempic.  Encouraged adequate hydration, decreased portion sizes, activity as tolerated.   5. Disposition: Follow-up in 3 months.    Lenna Sciara, NP 10/29/2022, 4:44 PM

## 2022-10-29 NOTE — Patient Instructions (Signed)
Medication Instructions:  Your physician recommends that you continue on your current medications as directed. Please refer to the Current Medication list given to you today.  *If you need a refill on your cardiac medications before your next appointment, please call your pharmacy*   Lab Work: NONE ordered at this time of appointment   If you have labs (blood work) drawn today and your tests are completely normal, you will receive your results only by: [place] (if you have MyChart) OR A paper copy in the mail If you have any lab test that is abnormal or we need to change your treatment, we will call you to review the results.   Testing/Procedures: Your physician has requested that you have an echocardiogram. Echocardiography is a painless test that uses sound waves to create images of your heart. It provides your doctor with information about the size and shape of your heart and how well your heart's chambers and valves are working. This procedure takes approximately one hour. There are no restrictions for this procedure. Please do NOT wear cologne, perfume, aftershave, or lotions (deodorant is allowed). Please arrive 15 minutes prior to your appointment time. ZIO AT Long term monitor-Live Telemetry  Your physician has requested you wear a ZIO patch monitor for 14 days.  This is a single patch monitor. [employer] supplies one patch monitor per enrollment. Additional  stickers are not available.  Please do not apply patch if you will be having a Nuclear Stress Test, Echocardiogram, Cardiac CT, MRI,  or Chest Xray during the period you would be wearing the monitor. The patch cannot be worn during  these tests. You cannot remove and re-apply the ZIO AT patch monitor.  Your ZIO patch monitor will be mailed 3 day USPS to your address on file. It may take 3-5 days to  receive your monitor after you have been enrolled.  Once you have received your monitor, please review the enclosed  instructions. Your monitor has  already been registered assigning a specific monitor serial # to you.   Billing and Patient Assistance Program information  Theodore Demark has been supplied with any insurance information on record for billing. [employer] offers a sliding scale Patient Assistance Program for patients without insurance, or whose  insurance does not completely cover the cost of the ZIO patch monitor. You must apply for the  Patient Assistance Program to qualify for the discounted rate. To apply, call [employer] at [phone],  select option 4, select option 2 , ask to apply for the Patient Assistance Program, (you can request an  interpreter if needed). [employer] will ask your household income and how many people are in your  household. [employer] will quote your out-of-pocket cost based on this information. They will also be able  to set up a 12 month interest free payment plan if needed.  Applying the monitor   Shave hair from upper left chest.  Hold the abrader disc by orange tab. Rub the abrader in 40 strokes over left upper chest as indicated in  your monitor instructions.  Clean area with 4 enclosed alcohol pads. Use all pads to ensure the area is cleaned thoroughly. Let  dry.  Apply patch as indicated in monitor instructions. Patch will be placed under collarbone on left side of  chest with arrow pointing upward.  Rub patch adhesive wings for 2 minutes. Remove the white label marked "1". Remove the white label  marked "2". Rub patch adhesive wings for 2 additional minutes.  While looking  in a mirror, press and release button in center of patch. A small green light will flash 3-4  times. This will be your only indicator that the monitor has been turned on.  Do not shower for the first 24 hours. You may shower after the first 24 hours.  Press the button if you feel a symptom. You will hear a small click. Record Date, Time and Symptom in  the Patient Log.   Starting the Gateway  In  your kit there is a Hydrographic surveyor box the size of a cellphone. This is Airline pilot. It transmits all your  recorded data to [facility]. This box must always stay within 10 feet of you. Open the box and push the *  button. There will be a light that blinks orange and then green a few times. When the light stops  blinking, the Gateway is connected to the ZIO patch. Call [employer] at [phone] to confirm your monitor is transmitting.  Returning your monitor  Remove your patch and place it inside the [place]. In the lower half of the Gateway there is a white  bag with prepaid postage on it. Place Gateway in bag and seal. Mail package back to [place] as soon as  possible. Your physician should have your final report approximately 7 days after you have mailed back  your monitor. Call [place] at [phone] if you have questions regarding your ZIO AT  patch monitor. Call them immediately if you see an orange light blinking on your monitor.  If your monitor falls off in less than 4 days, contact our Monitor department at [phone]. If your  monitor becomes loose or falls off after 4 days call [employer] at [phone] for suggestions on  securing your monitor   Follow-Up: At [facility], you and your health needs are our priority.  As part of our continuing mission to provide you with exceptional heart care, we have created designated Provider Care Teams.  These Care Teams include your primary Cardiologist (physician) and Advanced Practice Providers (APPs -  Physician Assistants and Nurse Practitioners) who all work together to provide you with the care you need, when you need it.  We recommend signing up for the patient portal called "MyChart".  Sign up information is provided on this After Visit Summary.  MyChart is used to connect with patients for Virtual Visits (Telemedicine).  Patients are able to view lab/test results, encounter notes, upcoming  appointments, etc.  Non-urgent messages can be sent to your provider as well.   To learn more about what you can do with MyChart, go to [email or web address].    Your next appointment:   3 month(s)  The format for your next appointment:   In Person  Provider:   Diona Browner, NP        Other Instructions   Important Information About Sugar

## 2022-11-25 ENCOUNTER — Ambulatory Visit (HOSPITAL_COMMUNITY): Payer: No Typology Code available for payment source | Attending: Nurse Practitioner

## 2022-11-25 DIAGNOSIS — R42 Dizziness and giddiness: Secondary | ICD-10-CM | POA: Insufficient documentation

## 2022-11-25 LAB — ECHOCARDIOGRAM COMPLETE
Area-P 1/2: 4.99 cm2
S' Lateral: 3.1 cm

## 2022-12-02 ENCOUNTER — Telehealth: Payer: Self-pay

## 2022-12-02 NOTE — Telephone Encounter (Signed)
Call transferred  

## 2022-12-02 NOTE — Telephone Encounter (Signed)
Lmom to discuss echo results. Waiting on a return call. 

## 2022-12-02 NOTE — Telephone Encounter (Signed)
Pt returned call and was notified of results. Pt will continue his current medication and follow up as planned.

## 2023-01-28 ENCOUNTER — Ambulatory Visit: Payer: No Typology Code available for payment source | Admitting: Nurse Practitioner

## 2023-03-03 ENCOUNTER — Encounter: Payer: Self-pay | Admitting: Nurse Practitioner

## 2023-03-03 ENCOUNTER — Ambulatory Visit: Payer: No Typology Code available for payment source | Attending: Nurse Practitioner | Admitting: Nurse Practitioner

## 2023-03-03 VITALS — BP 128/84 | HR 92 | Ht 72.0 in | Wt >= 6400 oz

## 2023-03-03 DIAGNOSIS — R42 Dizziness and giddiness: Secondary | ICD-10-CM

## 2023-03-03 DIAGNOSIS — I10 Essential (primary) hypertension: Secondary | ICD-10-CM

## 2023-03-03 DIAGNOSIS — R002 Palpitations: Secondary | ICD-10-CM

## 2023-03-03 DIAGNOSIS — E118 Type 2 diabetes mellitus with unspecified complications: Secondary | ICD-10-CM

## 2023-03-03 DIAGNOSIS — R000 Tachycardia, unspecified: Secondary | ICD-10-CM | POA: Diagnosis not present

## 2023-03-03 DIAGNOSIS — Z7985 Long-term (current) use of injectable non-insulin antidiabetic drugs: Secondary | ICD-10-CM

## 2023-03-03 NOTE — Progress Notes (Signed)
Office Visit    Patient Name: Tristan Perez Date of Encounter: 03/03/2023  Primary Care Provider:  Farris Has, MD Primary Cardiologist:  Maisie Fus, MD  Chief Complaint    52 year old male with a history of dizziness, palpitations, tachycardia, hypertension, vertigo, ADHD, obesity, and OSA who presents for follow-up related to dizziness.   Past Medical History    Past Medical History:  Diagnosis Date   ADHD    Constipation    AND ALTERNATES WITH DIARRHEA AND SOME BLOOD SINCE END OF JUNE 2020   Eczema    Fatty liver    High blood pressure    History of kidney stones    History of urinary tract infection    Hypertension    Pt states was on beta blocker 10 years ago but took only for 6 months    Kidney stones    Obesity    Palpitations    Prediabetes    Past Surgical History:  Procedure Laterality Date   COLONOSCOPY WITH PROPOFOL N/A 06/22/2019   Procedure: COLONOSCOPY WITH PROPOFOL;  Surgeon: Carman Ching, MD;  Location: [facility];  Service: Endoscopy;  Laterality: N/A;   CYSTOSCOPY/URETEROSCOPY/HOLMIUM LASER/STENT PLACEMENT Left 09/14/2016   Procedure: CYSTOSCOPY/RETROGRADES/URETEROSCOPY/HOLMIUM LASER/STENT PLACEMENT;  Surgeon: Heloise Purpura, MD;  Location: [facility];  Service: Urology;  Laterality: Left;   extraction of wisdom teeth      Allergies  No Known Allergies   Labs/Other Studies Reviewed    The following studies were reviewed today: Echo 10/2022: IMPRESSIONS    1. Left ventricular ejection fraction, by estimation, is 65 to 70%. The  left ventricle has normal function. The left ventricle has no regional  wall motion abnormalities. There is mild left ventricular hypertrophy.  Left ventricular diastolic parameters  are consistent with Grade I diastolic dysfunction (impaired relaxation).   2. Right ventricular systolic function is normal. The right ventricular  size is normal. There is normal pulmonary artery systolic pressure.   3. The  mitral valve is normal in structure. No evidence of mitral valve  regurgitation. No evidence of mitral stenosis.   4. The aortic valve is normal in structure. Aortic valve regurgitation is  not visualized. No aortic stenosis is present.   5. The inferior vena cava is normal in size with greater than 50%  respiratory variability, suggesting right atrial pressure of 3 mmHg.    Recent Labs: 06/19/2022: ALT 33 06/20/2022: TSH 3.007 10/18/2022: BUN 17; Creatinine, Ser 0.77; Hemoglobin 14.5; Platelets 377; Potassium 3.9; Sodium 137  Recent Lipid Panel    Component Value Date/Time   CHOL 171 10/02/2008 1152   TRIG 82 10/02/2008 1152   HDL 26.3 (L) 10/02/2008 1152   CHOLHDL 6.5 CALC 10/02/2008 1152   VLDL 16 10/02/2008 1152   LDLCALC 128 (H) 10/02/2008 1152    History of Present Illness    52 year old male with the above past medical history including dizziness, palpitations, tachycardia, hypertension, vertigo, ADHD, obesity, and OSA.   Echocardiogram in 2021 showed normal LV/RV function, no significant valvular disease.  He has had multiple ED visits in the setting of dizziness which he describes as a swaying sensation.  He has followed with ENT as an outpatient who reportedly does not think his symptoms are related to central peripheral vertigo but rather cardiovascular in origin.  He presented to the ED  again on 10/18/2022 with complaints of dizziness to prior episodes.  CT of the head/CTA head were negative for acute findings.  He was last seen  in the office on 10/29/2022 and was stable from a cardiac standpoint.  He noted daily episodes of dizziness which he described as a swaying sensation as if he were in a boat. Repeat echocardiogram was essentially normal. Cardiac monitor was ordered, however, results are still pending.   He presents today for follow-up. Since his last visit he has been stable from a cardiac standpoint.  He denies any further dizziness, presyncope, syncope, denies  palpitations, chest pain, dyspnea.  He notes he never received his heart monitor.  In the absence of any recent palpitations or dizziness, he is not interested in pursuing cardiac monitor at this time.  He recently started taking Mounjaro.  Overall, he reports feeling well.  Home Medications    Current Outpatient Medications  Medication Sig Dispense Refill   amLODipine (NORVASC) 2.5 MG tablet Take 1 tablet (2.5 mg total) by mouth daily. 30 tablet 1   atorvastatin (LIPITOR) 10 MG tablet Take 10 mg by mouth daily.     ibuprofen (ADVIL) 200 MG tablet Take 200 mg by mouth every 4 (four) hours as needed for mild pain.     meclizine (ANTIVERT) 25 MG tablet Take 1 tablet (25 mg total) by mouth 3 (three) times daily as needed for dizziness. 30 tablet 0   meloxicam (MOBIC) 15 MG tablet Take 15 mg by mouth daily as needed.     MOUNJARO 5 MG/0.5ML Pen Inject 5 mg into the skin once a week.     Vitamin D, Ergocalciferol, (DRISDOL) 1.25 MG (50000 UNIT) CAPS capsule Take 50,000 Units by mouth once a week.     metoprolol tartrate (LOPRESSOR) 25 MG tablet Take 1 tablet (25 mg total) by mouth 2 (two) times daily. 60 tablet 11   No current facility-administered medications for this visit.     Review of Systems    He denies chest pain, palpitations, dyspnea, pnd, orthopnea, n, v, dizziness, syncope, edema, weight gain, or early satiety. All other systems reviewed and are otherwise negative except as noted above.   Physical Exam    VS:  BP 128/84   Pulse 92   Ht 6' (1.829 m)   Wt (!) 444 lb 9.6 oz (201.7 kg)   SpO2 94%   BMI 60.30 kg/m  GEN: Well nourished, well developed, in no acute distress. HEENT: normal. Neck: Supple, no JVD, carotid bruits, or masses. Cardiac: RRR, no murmurs, rubs, or gallops. No clubbing, cyanosis, edema.  Radials/DP/PT 2+ and equal bilaterally.  Respiratory:  Respirations regular and unlabored, clear to auscultation bilaterally. GI: Obese, soft, nontender, nondistended, BS +  x 4. MS: no deformity or atrophy. Skin: warm and dry, no rash. Neuro:  Strength and sensation are intact. Psych: Normal affect.  Accessory Clinical Findings    ECG personally reviewed by me today - No EKG in office today.     Lab Results  Component Value Date   WBC 11.0 (H) 10/18/2022   HGB 14.5 10/18/2022   HCT 44.8 10/18/2022   MCV 86.0 10/18/2022   PLT 377 10/18/2022   Lab Results  Component Value Date   CREATININE 0.77 10/18/2022   BUN 17 10/18/2022   NA 137 10/18/2022   K 3.9 10/18/2022   CL 105 10/18/2022   CO2 26 10/18/2022   Lab Results  Component Value Date   ALT 33 06/19/2022   AST 26 06/19/2022   ALKPHOS 82 06/19/2022   BILITOT 0.5 06/19/2022   Lab Results  Component Value Date   CHOL 171 10/02/2008  HDL 26.3 (L) 10/02/2008   LDLCALC 128 (H) 10/02/2008   TRIG 82 10/02/2008   CHOLHDL 6.5 CALC 10/02/2008    Lab Results  Component Value Date   HGBA1C 5.8 (H) 07/10/2019    Assessment & Plan    1. Dizziness: Prior episodes of a sensation of swaying as if he were in a boat. Symptoms have resolved since he started taking Mounjaro.  He denies any further dizziness, denies presyncope, syncope, denies palpitations.  CTA head reassuring.  Evaluated by ENT who felt his symptoms were potentially cardiac in nature.  Recent echo was normal.  He never received his 14-day Zio patch.  In the absence of any recurrent dizziness or palpitations, patient declines cardiac monitor at this point in time.  I advised him to notify us should he have recurrent symptoms, could consider monitor at that point in time.  Discussed ED precautions.  Encouraged weight loss, activity as tolerated.   2. Palpitations/Tachycardia: Denies any recent palpitations. Stable on metoprolol.    3. Hypertension: BP well controlled. Continue current antihypertensive regimen.    4. Obesity/type 2 diabetes: A1c was 7.1 in 10/2022. He recently started [facility].  Encouraged adequate hydration, decreased  portion sizes, activity as tolerated.    5. Disposition: Follow-up in 6 months.       Joylene Grapes, NP 03/03/2023, 8:45 AM

## 2023-03-03 NOTE — Patient Instructions (Addendum)
Medication Instructions:  Your physician recommends that you continue on your current medications as directed. Please refer to the Current Medication list given to you today.   *If you need a refill on your cardiac medications before your next appointment, please call your pharmacy*   Lab Work: NONE ordered at this time of appointment   If you have labs (blood work) drawn today and your tests are completely normal, you will receive your results only by: MyChart Message (if you have MyChart) OR A paper copy in the mail If you have any lab test that is abnormal or we need to change your treatment, we will call you to review the results.   Testing/Procedures: NONE ordered at this time of appointment     Follow-Up: At [facility], you and your health needs are our priority.  As part of our continuing mission to provide you with exceptional heart care, we have created designated Provider Care Teams.  These Care Teams include your primary Cardiologist (physician) and Advanced Practice Providers (APPs -  Physician Assistants and Nurse Practitioners) who all work together to provide you with the care you need, when you need it.  We recommend signing up for the patient portal called "MyChart".  Sign up information is provided on this After Visit Summary.  MyChart is used to connect with patients for Virtual Visits (Telemedicine).  Patients are able to view lab/test results, encounter notes, upcoming appointments, etc.  Non-urgent messages can be sent to your provider as well.   To learn more about what you can do with MyChart, go to [email or web address].    Your next appointment:   6 month(s)  Provider:   Branch, Mary E, MD     Other Instructions   

## 2023-09-06 ENCOUNTER — Encounter: Payer: Self-pay | Admitting: Internal Medicine

## 2023-09-06 ENCOUNTER — Ambulatory Visit: Payer: No Typology Code available for payment source | Attending: Internal Medicine | Admitting: Internal Medicine

## 2023-09-06 VITALS — BP 128/74 | HR 82 | Ht 72.0 in | Wt >= 6400 oz

## 2023-09-06 DIAGNOSIS — I10 Essential (primary) hypertension: Secondary | ICD-10-CM

## 2023-09-06 NOTE — Patient Instructions (Signed)
Medication Instructions:  Continue taking medication  *If you need a refill on your cardiac medications before your next appointment, please call your pharmacy*   Lab Work: None  If you have labs (blood work) drawn today and your tests are completely normal, you will receive your results only by: MyChart Message (if you have MyChart) OR A paper copy in the mail If you have any lab test that is abnormal or we need to change your treatment, we will call you to review the results.   Testing/Procedures: None    Follow-Up: At [facility], you and your health needs are our priority.  As part of our continuing mission to provide you with exceptional heart care, we have created designated Provider Care Teams.  These Care Teams include your primary Cardiologist (physician) and Advanced Practice Providers (APPs -  Physician Assistants and Nurse Practitioners) who all work together to provide you with the care you need, when you need it.   Your next appointment:   1 year(s)  Provider:   Maisie Fus, MD

## 2023-09-06 NOTE — Progress Notes (Signed)
Cardiology Office Note:    Date:  09/06/2023   ID:  Tristan Perez, DOB [birth year], MRN [id number]  PCP:  Farris Has, MD   [facility] Providers Cardiologist:  Maisie Fus, MD      Referring MD: Farris Has, MD   No chief complaint on file. Sinus tachycardia  History of Present Illness:    Tristan Perez is a 52 y.o. male with a hx of HTN, ADHD, obesity OSA, referral from the ED for tachycardia.  He came to the ED for a swaying sensation. Today he is asymptomatic.  His heart rate is improved. He has vertigo. No syncope.  Echo 2021, showed normal LV/RV function, no valve dx.  Interim hx 09/06/2023 He saw Bernadene Person and was doing well.  He did not feel the need to pursue a Zio patch.  His A1c was borderline at 7.1 and he has started [facility].  His blood pressure was well-controlled. He notes a big difference with significant weight loss. He is in [facility].  He denies palpitations. No changes with family hx.   Past Medical History:  Diagnosis Date   ADHD    Constipation    AND ALTERNATES WITH DIARRHEA AND SOME BLOOD SINCE END OF JUNE 2020   Eczema    Fatty liver    High blood pressure    History of kidney stones    History of urinary tract infection    Hypertension    Pt states was on beta blocker 10 years ago but took only for 6 months    Kidney stones    Obesity    Palpitations    Prediabetes     Past Surgical History:  Procedure Laterality Date   COLONOSCOPY WITH PROPOFOL N/A 06/22/2019   Procedure: COLONOSCOPY WITH PROPOFOL;  Surgeon: Carman Ching, MD;  Location: [facility];  Service: Endoscopy;  Laterality: N/A;   CYSTOSCOPY/URETEROSCOPY/HOLMIUM LASER/STENT PLACEMENT Left 09/14/2016   Procedure: CYSTOSCOPY/RETROGRADES/URETEROSCOPY/HOLMIUM LASER/STENT PLACEMENT;  Surgeon: Heloise Purpura, MD;  Location: [facility];  Service: Urology;  Laterality: Left;   extraction of wisdom teeth      Current Medications: Current Outpatient  Medications on File Prior to Visit  Medication Sig Dispense Refill   amLODipine (NORVASC) 2.5 MG tablet Take 1 tablet (2.5 mg total) by mouth daily. 30 tablet 1   atorvastatin (LIPITOR) 10 MG tablet Take 10 mg by mouth daily.     ibuprofen (ADVIL) 200 MG tablet Take 200 mg by mouth every 4 (four) hours as needed for mild pain.     meclizine (ANTIVERT) 25 MG tablet Take 1 tablet (25 mg total) by mouth 3 (three) times daily as needed for dizziness. 30 tablet 0   meloxicam (MOBIC) 15 MG tablet Take 15 mg by mouth daily as needed.     MOUNJARO 5 MG/0.5ML Pen Inject 5 mg into the skin once a week.     Vitamin D, Ergocalciferol, (DRISDOL) 1.25 MG (50000 UNIT) CAPS capsule Take 50,000 Units by mouth once a week.     metoprolol tartrate (LOPRESSOR) 25 MG tablet Take 1 tablet (25 mg total) by mouth 2 (two) times daily. 60 tablet 11   No current facility-administered medications on file prior to visit.     Family History: The patient's family history includes Diabetes in his father; Hyperlipidemia in his mother; Hypertension in his father; Obesity in his father; Stroke in an other family member.  ROS:   Please see the history of present illness.  All other systems reviewed and are negative.  EKGs/Labs/Other Studies Reviewed:    The following studies were reviewed today:  EKG Interpretation Date/Time:  Monday September 06 2023 09:23:35 EST Ventricular Rate:  82 PR Interval:  146 QRS Duration:  74 QT Interval:  378 QTC Calculation: 441 R Axis:   0  Text Interpretation: Normal sinus rhythm Normal ECG When compared with ECG of 18-Oct-2022 13:30, PREVIOUS ECG IS PRESENT Confirmed by Carolan Clines ([id number]) on 09/06/2023 9:26:47 AM   Recent Labs: 10/18/2022: BUN 17; Creatinine, Ser 0.77; Hemoglobin 14.5; Platelets 377; Potassium 3.9; Sodium 137   Recent Lipid Panel    Component Value Date/Time   CHOL 171 10/02/2008 1152   TRIG 82 10/02/2008 1152   HDL 26.3 (L) 10/02/2008 1152   CHOLHDL  6.5 CALC 10/02/2008 1152   VLDL 16 10/02/2008 1152   LDLCALC 128 (H) 10/02/2008 1152     Risk Assessment/Calculations:     Physical Exam:    VS:    Wt Readings from Last 3 Encounters:  03/03/23 (!) 444 lb 9.6 oz (201.7 kg)  10/29/22 (!) 463 lb (210 kg)  10/18/22 (!) 448 lb (203.2 kg)     GEN: morbidly obese, BMI 56, Well nourished, well developed in no acute distress HEENT: Normal NECK: No JVD; No carotid bruits LYMPHATICS: No lymphadenopathy CARDIAC: RRR, no murmurs, rubs, gallops RESPIRATORY:  Clear to auscultation without rales, wheezing or rhonchi  ABDOMEN: Soft, non-tender, non-distended MUSCULOSKELETAL:  No edema; No deformity  SKIN: Warm and dry NEUROLOGIC:  Alert and oriented x 3 PSYCHIATRIC:  Normal affect   ASSESSMENT:   Sinus tachycardia : noted some anxiety prior to his visit. This can contribute. He does not have high risk features including syncope c/f arrhythmia , family hx of SCD, or abnormalities on her EKGs.   We discussed stress relief and self care. He is working with weight management via his PCP.  PLAN:    In order of problems listed above:  Follow up in 1 year      Medication Adjustments/Labs and Tests Ordered: Current medicines are reviewed at length with the patient today.  Concerns regarding medicines are outlined above.  Orders Placed This Encounter  Procedures   EKG 12-Lead   No orders of the defined types were placed in this encounter.   There are no Patient Instructions on file for this visit.   Signed, Maisie Fus, MD  09/06/2023 9:21 AM    [facility]
